# Patient Record
Sex: Female | Born: 1960 | Race: Black or African American | Hispanic: No | Smoking: Never smoker
Health system: Southern US, Community
[De-identification: ages and names within clinical notes are randomized; demographics above are authoritative.]

## PROBLEM LIST (undated history)

## (undated) DIAGNOSIS — Z86718 Personal history of other venous thrombosis and embolism: Secondary | ICD-10-CM

## (undated) DIAGNOSIS — G4730 Sleep apnea, unspecified: Secondary | ICD-10-CM

## (undated) DIAGNOSIS — I10 Essential (primary) hypertension: Secondary | ICD-10-CM

## (undated) DIAGNOSIS — Z950 Presence of cardiac pacemaker: Secondary | ICD-10-CM

## (undated) DIAGNOSIS — M549 Dorsalgia, unspecified: Secondary | ICD-10-CM

## (undated) HISTORY — DX: Dorsalgia, unspecified: M54.9

## (undated) HISTORY — DX: Sleep apnea, unspecified: G47.30

## (undated) HISTORY — PX: SKIN GRAFT: SHX250

## (undated) HISTORY — PX: OTHER SURGICAL HISTORY: SHX169

## (undated) HISTORY — DX: Personal history of other venous thrombosis and embolism: Z86.718

## (undated) HISTORY — PX: PACEMAKER IMPLANT: EP1218

## (undated) HISTORY — DX: Presence of cardiac pacemaker: Z95.0

---

## 2014-01-04 DIAGNOSIS — T2121XA Burn of second degree of chest wall, initial encounter: Secondary | ICD-10-CM | POA: Insufficient documentation

## 2018-06-28 DIAGNOSIS — I10 Essential (primary) hypertension: Secondary | ICD-10-CM | POA: Insufficient documentation

## 2018-06-28 DIAGNOSIS — Z8249 Family history of ischemic heart disease and other diseases of the circulatory system: Secondary | ICD-10-CM | POA: Insufficient documentation

## 2018-06-28 DIAGNOSIS — I495 Sick sinus syndrome: Secondary | ICD-10-CM | POA: Insufficient documentation

## 2018-06-29 HISTORY — PX: PACEMAKER PLACEMENT: SHX43

## 2018-06-30 DIAGNOSIS — Z950 Presence of cardiac pacemaker: Secondary | ICD-10-CM | POA: Insufficient documentation

## 2019-03-15 ENCOUNTER — Ambulatory Visit (HOSPITAL_BASED_OUTPATIENT_CLINIC_OR_DEPARTMENT_OTHER)
Admission: RE | Admit: 2019-03-15 | Discharge: 2019-03-15 | Disposition: A | Discharge status: Home / Self Care | Payer: Self-pay | Source: Ambulatory Visit | Attending: Emergency Medicine | Admitting: Emergency Medicine

## 2019-03-15 ENCOUNTER — Emergency Department (HOSPITAL_BASED_OUTPATIENT_CLINIC_OR_DEPARTMENT_OTHER)
Admission: EM | Admit: 2019-03-15 | Discharge: 2019-03-15 | Disposition: A | Discharge status: Home / Self Care | Payer: Self-pay | Attending: Emergency Medicine | Admitting: Emergency Medicine

## 2019-03-15 ENCOUNTER — Other Ambulatory Visit (HOSPITAL_BASED_OUTPATIENT_CLINIC_OR_DEPARTMENT_OTHER): Payer: Self-pay | Admitting: Emergency Medicine

## 2019-03-15 ENCOUNTER — Encounter (HOSPITAL_BASED_OUTPATIENT_CLINIC_OR_DEPARTMENT_OTHER): Payer: Self-pay

## 2019-03-15 ENCOUNTER — Other Ambulatory Visit: Payer: Self-pay

## 2019-03-15 DIAGNOSIS — M79604 Pain in right leg: Secondary | ICD-10-CM | POA: Insufficient documentation

## 2019-03-15 DIAGNOSIS — I10 Essential (primary) hypertension: Secondary | ICD-10-CM | POA: Insufficient documentation

## 2019-03-15 DIAGNOSIS — R52 Pain, unspecified: Secondary | ICD-10-CM

## 2019-03-15 DIAGNOSIS — Z950 Presence of cardiac pacemaker: Secondary | ICD-10-CM | POA: Insufficient documentation

## 2019-03-15 DIAGNOSIS — M25561 Pain in right knee: Secondary | ICD-10-CM | POA: Insufficient documentation

## 2019-03-15 DIAGNOSIS — Z79899 Other long term (current) drug therapy: Secondary | ICD-10-CM | POA: Insufficient documentation

## 2019-03-15 HISTORY — DX: Essential (primary) hypertension: I10

## 2019-03-15 LAB — BASIC METABOLIC PANEL
Anion gap: 7 (ref 5–15)
BUN: 18 mg/dL (ref 6–20)
CO2: 28 mmol/L (ref 22–32)
Calcium: 8.7 mg/dL — ABNORMAL LOW (ref 8.9–10.3)
Chloride: 106 mmol/L (ref 98–111)
Creatinine, Ser: 0.89 mg/dL (ref 0.44–1.00)
GFR calc Af Amer: >60 mL/min (ref >60)
GFR calc non Af Amer: >60 mL/min (ref >60)
Glucose, Bld: 113 mg/dL — ABNORMAL HIGH (ref 70–99)
Potassium: 3.7 mmol/L (ref 3.5–5.1)
Sodium: 141 mmol/L (ref 135–145)

## 2019-03-15 LAB — CBC WITH DIFFERENTIAL/PLATELET
Abs Immature Granulocytes: 0.01 10*3/uL (ref 0.00–0.07)
Basophils Absolute: 0 10*3/uL (ref 0.0–0.1)
Basophils Relative: 0 %
Eosinophils Absolute: 0.2 10*3/uL (ref 0.0–0.5)
Eosinophils Relative: 2 %
HCT: 39.3 % (ref 36.0–46.0)
Hemoglobin: 12.6 g/dL (ref 12.0–15.0)
Immature Granulocytes: 0 %
Lymphocytes Relative: 29 %
Lymphs Abs: 2.1 10*3/uL (ref 0.7–4.0)
MCH: 23.5 pg — ABNORMAL LOW (ref 26.0–34.0)
MCHC: 32.1 g/dL (ref 30.0–36.0)
MCV: 73.2 fL — ABNORMAL LOW (ref 80.0–100.0)
Monocytes Absolute: 0.6 10*3/uL (ref 0.1–1.0)
Monocytes Relative: 9 %
Neutro Abs: 4.2 10*3/uL (ref 1.7–7.7)
Neutrophils Relative %: 60 %
Platelets: 168 10*3/uL (ref 150–400)
RBC: 5.37 MIL/uL — ABNORMAL HIGH (ref 3.87–5.11)
RDW: 15.4 % (ref 11.5–15.5)
WBC: 7.2 10*3/uL (ref 4.0–10.5)
nRBC: 0 % (ref 0.0–0.2)

## 2019-03-15 MED ORDER — ENOXAPARIN SODIUM 150 MG/ML ~~LOC~~ SOLN
1.0000 mg/kg | Freq: Once | SUBCUTANEOUS | Status: AC
  Administered 2019-03-15: 145 mg via SUBCUTANEOUS
  Filled 2019-03-15: qty 1

## 2019-03-15 NOTE — ED Provider Notes (Signed)
[facility] EMERGENCY DEPARTMENT Provider Note   CSN: [id number] Arrival date & time: 03/15/19  0023     History   Chief Complaint Chief Complaint  Patient presents with  . Leg Pain    HPI Melanie Richard is a 58 y.o. female.     The history is provided by the patient.  Leg Pain Location:  Knee (popliteal fossa on R) Time since incident:  1 day Injury: no   Knee location:  R knee Pain details:    Quality:  Sharp   Radiates to:  Does not radiate   Severity:  Moderate   Onset quality:  Gradual   Timing:  Rare (2 episodes lasting 15 minutes each time) Chronicity:  New Dislocation: no   Foreign body present:  No foreign bodies Prior injury to area:  No Relieved by:  Nothing Worsened by:  Nothing Ineffective treatments:  None tried Associated symptoms: no back pain, no decreased ROM, no fatigue, no fever, no itching, no muscle weakness, no neck pain, no numbness, no stiffness, no swelling and no tingling   Associated symptoms comment:  No chest pain, no shortness of breath, no cough Risk factors: no recent illness     Past Medical History:  Diagnosis Date  . Hypertension     There are no active problems to display for this patient.   Past Surgical History:  Procedure Laterality Date  . PACEMAKER PLACEMENT       OB History   No obstetric history on file.      Home Medications    Prior to Admission medications   Medication Sig Start Date End Date Taking? Authorizing Provider  hydrochlorothiazide (MICROZIDE) 12.5 MG capsule Take 12.5 mg by mouth daily. 02/13/19   [provider]  lisinopril (ZESTRIL) 5 MG tablet Take 5 mg by mouth daily. 12/27/18   [provider]    Family History No family history on file.  Social History Social History   Tobacco Use  . Smoking status: Never Smoker  Substance Use Topics  . Alcohol use: Never    Frequency: Never  . Drug use: Never     Allergies   Patient has no allergy  information on record.   Review of Systems Review of Systems  Constitutional: Negative for fatigue and fever.  HENT: Negative for congestion.   Eyes: Negative for visual disturbance.  Respiratory: Negative for cough, chest tightness and shortness of breath.   Cardiovascular: Negative for chest pain, palpitations and leg swelling.  Gastrointestinal: Negative for abdominal pain.  Genitourinary: Negative for difficulty urinating.  Musculoskeletal: Negative for back pain, neck pain and stiffness.  Skin: Negative for itching.  Neurological: Negative for dizziness.  Psychiatric/Behavioral: Negative for agitation.  All other systems reviewed and are negative.    Physical Exam Updated Vital Signs BP (!) 151/107   Pulse 64   Temp 97.7 F (36.5 C) (Oral)   Resp 17   Ht 5\' 9"  (1.753 m)   Wt (!) 147.4 kg   SpO2 99%   BMI 47.99 kg/m   Physical Exam Vitals signs and nursing note reviewed.  Constitutional:      General: She is not in acute distress.    Appearance: Normal appearance.  HENT:     Head: Normocephalic and atraumatic.     Nose: Nose normal.  Eyes:     Extraocular Movements: Extraocular movements intact.     Conjunctiva/sclera: Conjunctivae normal.  Neck:     Musculoskeletal: Normal range of motion and  neck supple.  Cardiovascular:     Rate and Rhythm: Normal rate and regular rhythm.     Pulses: Normal pulses.     Heart sounds: Normal heart sounds.  Pulmonary:     Effort: Pulmonary effort is normal.     Breath sounds: Normal breath sounds.  Abdominal:     General: Abdomen is flat. Bowel sounds are normal.     Tenderness: There is no abdominal tenderness. There is no guarding.  Musculoskeletal:        General: No swelling or tenderness.     Right knee: Normal.     Right ankle: Normal. Achilles tendon normal.     Right upper leg: Normal.     Right lower leg: Normal. No edema.     Left lower leg: No edema.     Right foot: Normal.     Left foot: Normal.  Skin:     General: Skin is warm and dry.     Capillary Refill: Capillary refill takes less than 2 seconds.  Neurological:     General: No focal deficit present.     Mental Status: She is alert and oriented to person, place, and time.  Psychiatric:        Mood and Affect: Mood normal.        Behavior: Behavior normal.      ED Treatments / Results  Labs (all labs ordered are listed, but only abnormal results are displayed) Results for orders placed or performed during the hospital encounter of [id number]  Basic metabolic panel  Result Value Ref Range   Sodium 141 135 - 145 mmol/L   Potassium 3.7 3.5 - 5.1 mmol/L   Chloride 106 98 - 111 mmol/L   CO2 28 22 - 32 mmol/L   Glucose, Bld 113 (H) 70 - 99 mg/dL   BUN 18 6 - 20 mg/dL   Creatinine, Ser 0.89 0.44 - 1.00 mg/dL   Calcium 8.7 (L) 8.9 - 10.3 mg/dL   GFR calc non Af Amer >60 >60 mL/min   GFR calc Af Amer >60 >60 mL/min   Anion gap 7 5 - 15  CBC with Differential  Result Value Ref Range   WBC 7.2 4.0 - 10.5 K/uL   RBC 5.37 (H) 3.87 - 5.11 MIL/uL   Hemoglobin 12.6 12.0 - 15.0 g/dL   HCT 39.3 36.0 - 46.0 %   MCV 73.2 (L) 80.0 - 100.0 fL   MCH 23.5 (L) 26.0 - 34.0 pg   MCHC 32.1 30.0 - 36.0 g/dL   RDW 15.4 11.5 - 15.5 %   Platelets 168 150 - 400 K/uL   nRBC 0.0 0.0 - 0.2 %   Neutrophils Relative % 60 %   Neutro Abs 4.2 1.7 - 7.7 K/uL   Lymphocytes Relative 29 %   Lymphs Abs 2.1 0.7 - 4.0 K/uL   Monocytes Relative 9 %   Monocytes Absolute 0.6 0.1 - 1.0 K/uL   Eosinophils Relative 2 %   Eosinophils Absolute 0.2 0.0 - 0.5 K/uL   Basophils Relative 0 %   Basophils Absolute 0.0 0.0 - 0.1 K/uL   Immature Granulocytes 0 %   Abs Immature Granulocytes 0.01 0.00 - 0.07 K/uL   No results found.  Radiology No results found.  Procedures Procedures (including critical care time)  Medications Ordered in ED Medications  enoxaparin (LOVENOX) injection 145 mg (145 mg Subcutaneous Given 03/15/19 0125)     Initial Impression /  Assessment and Plan / ED Course  Given patient's history patient was given a dose of lovenox and scheduled for outpatient DVT study of the RLE.  Ice, and tylenol for pain.  I suspect this is actually a Baker's cyst but we must exclude DVT.  I do not suspect PE as the patient has normal pulse ox, no chest pain, no cough, no shortness of breath.   Melanie Richard was evaluated in Emergency Department on 03/15/2019 for the symptoms described in the history of present illness. She was evaluated in the context of the global COVID-19 pandemic, which necessitated consideration that the patient might be at risk for infection with the SARS-CoV-2 virus that causes COVID-19. Institutional protocols and algorithms that pertain to the evaluation of patients at risk for COVID-19 are in a state of rapid change based on information released by regulatory bodies including the CDC and federal and state organizations. These policies and algorithms were followed during the patient's care in the ED.   Final Clinical Impressions(s) / ED Diagnoses   Final diagnoses:  Right leg pain    Return for intractable cough, coughing up blood,fevers >100.4 unrelieved by medication, shortness of breath, intractable vomiting, chest pain, shortness of breath, weakness,numbness, changes in speech, facial asymmetry,abdominal pain, passing out,Inability to tolerate liquids or food, cough, altered mental status or any concerns. No signs of systemic illness or infection. The patient is nontoxic-appearing on exam and vital signs are within normal limits.   I have reviewed the triage vital signs and the nursing notes. Pertinent labs &imaging results that were available during my care of the patient were reviewed by me and considered in my medical decision making (see chart for details).  After history, exam, and medical workup I feel the patient has been appropriately medically screened and is safe for discharge home. Pertinent  diagnoses were discussed with the patient. Patient was given return precautions   Melanie Funnell, MD 03/15/19 0202

## 2019-03-15 NOTE — ED Triage Notes (Signed)
Pt presents with pain to R calf- hx of DVT in 2013. Pt denies SOB. Pt denies injury. Pt ambulatory to treatment room.

## 2019-03-15 NOTE — ED Notes (Signed)
Pain behind Left knee. Skin intact. No calf pain. Skin with small red area behind knee where pt has been rubbing.

## 2019-03-15 NOTE — ED Notes (Signed)
Pt has outpatient [place] appointment for 11/27 at 0900

## 2019-07-22 ENCOUNTER — Other Ambulatory Visit: Payer: Self-pay

## 2019-07-23 ENCOUNTER — Other Ambulatory Visit (HOSPITAL_COMMUNITY)
Admission: RE | Admit: 2019-07-23 | Discharge: 2019-07-23 | Disposition: A | Discharge status: Home / Self Care | Payer: 59 | Source: Ambulatory Visit | Attending: Nurse Practitioner | Admitting: Nurse Practitioner

## 2019-07-23 ENCOUNTER — Encounter: Payer: Self-pay | Admitting: Nurse Practitioner

## 2019-07-23 ENCOUNTER — Ambulatory Visit (INDEPENDENT_AMBULATORY_CARE_PROVIDER_SITE_OTHER): Payer: 59 | Admitting: Nurse Practitioner

## 2019-07-23 ENCOUNTER — Other Ambulatory Visit: Payer: Self-pay | Admitting: Nurse Practitioner

## 2019-07-23 VITALS — BP 130/88 | HR 70 | Temp 96.7°F | Ht 69.69 in | Wt 348.8 lb

## 2019-07-23 DIAGNOSIS — Z124 Encounter for screening for malignant neoplasm of cervix: Secondary | ICD-10-CM | POA: Insufficient documentation

## 2019-07-23 DIAGNOSIS — Z13220 Encounter for screening for lipoid disorders: Secondary | ICD-10-CM | POA: Diagnosis not present

## 2019-07-23 DIAGNOSIS — G4733 Obstructive sleep apnea (adult) (pediatric): Secondary | ICD-10-CM | POA: Diagnosis not present

## 2019-07-23 DIAGNOSIS — I10 Essential (primary) hypertension: Secondary | ICD-10-CM

## 2019-07-23 DIAGNOSIS — Z0001 Encounter for general adult medical examination with abnormal findings: Secondary | ICD-10-CM | POA: Diagnosis not present

## 2019-07-23 DIAGNOSIS — Z1231 Encounter for screening mammogram for malignant neoplasm of breast: Secondary | ICD-10-CM

## 2019-07-23 DIAGNOSIS — N841 Polyp of cervix uteri: Secondary | ICD-10-CM | POA: Insufficient documentation

## 2019-07-23 DIAGNOSIS — Z9989 Dependence on other enabling machines and devices: Secondary | ICD-10-CM

## 2019-07-23 DIAGNOSIS — Z136 Encounter for screening for cardiovascular disorders: Secondary | ICD-10-CM | POA: Diagnosis not present

## 2019-07-23 DIAGNOSIS — Z1211 Encounter for screening for malignant neoplasm of colon: Secondary | ICD-10-CM

## 2019-07-23 DIAGNOSIS — Z0000 Encounter for general adult medical examination without abnormal findings: Secondary | ICD-10-CM | POA: Insufficient documentation

## 2019-07-23 LAB — COMPREHENSIVE METABOLIC PANEL
ALT: 9 U/L (ref 0–35)
AST: 11 U/L (ref 0–37)
Albumin: 3.9 g/dL (ref 3.5–5.2)
Alkaline Phosphatase: 69 U/L (ref 39–117)
BUN: 17 mg/dL (ref 6–23)
CO2: 28 mEq/L (ref 19–32)
Calcium: 9.1 mg/dL (ref 8.4–10.5)
Chloride: 103 mEq/L (ref 96–112)
Creatinine, Ser: 0.81 mg/dL (ref 0.40–1.20)
GFR: 88.3 mL/min (ref >60.00)
Glucose, Bld: 104 mg/dL — ABNORMAL HIGH (ref 70–99)
Potassium: 4.3 mEq/L (ref 3.5–5.1)
Sodium: 138 mEq/L (ref 135–145)
Total Bilirubin: 0.5 mg/dL (ref 0.2–1.2)
Total Protein: 6.9 g/dL (ref 6.0–8.3)

## 2019-07-23 LAB — LIPID PANEL
Cholesterol: 174 mg/dL (ref 0–200)
HDL: 44.2 mg/dL (ref >39.00)
LDL Cholesterol: 107 mg/dL — ABNORMAL HIGH (ref 0–99)
NonHDL: 130.01
Total CHOL/HDL Ratio: 4
Triglycerides: 114 mg/dL (ref 0.0–149.0)
VLDL: 22.8 mg/dL (ref 0.0–40.0)

## 2019-07-23 LAB — CBC
HCT: 39.9 % (ref 36.0–46.0)
Hemoglobin: 13.2 g/dL (ref 12.0–15.0)
MCHC: 33.2 g/dL (ref 30.0–36.0)
MCV: 71.6 fl — ABNORMAL LOW (ref 78.0–100.0)
Platelets: 163 10*3/uL (ref 150.0–400.0)
RBC: 5.58 Mil/uL — ABNORMAL HIGH (ref 3.87–5.11)
RDW: 16.5 % — ABNORMAL HIGH (ref 11.5–15.5)
WBC: 7.1 10*3/uL (ref 4.0–10.5)

## 2019-07-23 LAB — TSH: TSH: 0.67 u[IU]/mL (ref 0.35–4.50)

## 2019-07-23 MED ORDER — LISINOPRIL 5 MG PO TABS: 5.0000 mg | ORAL_TABLET | Freq: Every day | ORAL | 1 refills | Status: DC

## 2019-07-23 MED ORDER — HYDROCHLOROTHIAZIDE 12.5 MG PO CAPS: 12.5000 mg | ORAL_CAPSULE | Freq: Every day | ORAL | 0 refills | Status: DC

## 2019-07-23 NOTE — Progress Notes (Signed)
Subjective:    Patient ID: Melanie Richard, female    DOB: 07/04/1960, 59 y.o.   MRN: [id number]  Patient presents today for complete physical and establish care (new patient)  HPI Melanie Richard reports hx of HTN, OSA with CPAP use, Sinus Node dysfunction which led to insertion of pacemaker in 2020. Today Melanie Richard denies any acute complaints.  HTN: BP at goal with HCTZ and lisinopril  Sexual History (orientation,birth control, marital status, STD):divorced, sexually active, denies need for STD screen, postmenopausal, needs repeat PAP, denies hx of abnormal PAP, also needs referral to mammogram. Unable to complete colonoscopy last year due to marked sinus bradycardia.  Depression/Suicide: Depression screen [facility] 2/9 07/23/2019  Decreased Interest 0  Down, Depressed, Hopeless 0  PHQ - 2 Score 0   Vision:up to date  Dental:dentures (upper and lower)  Immunizations: (TDAP, Hep C screen, Pneumovax, Influenza, zoster)  Health Maintenance  Topic Date Due  .  Hepatitis C: One time screening is recommended by Center for Disease Control  (CDC) for  adults born from [birth year] through 1965.   Never done  . HIV Screening  Never done  . Tetanus Vaccine  Never done  . Pap Smear  Never done  . Mammogram  Never done  . Colon Cancer Screening  Never done  . Flu Shot  11/17/2019   Diet:regular.  Weight:  Wt Readings from Last 3 Encounters:  07/23/19 (!) 348 lb 12.8 oz (158.2 kg)  03/15/19 (!) 325 lb (147.4 kg)    Exercise:none  Fall Risk: Fall Risk  07/23/2019  Falls in the past year? 0  Number falls in past yr: 0  Injury with Fall? 0   Advanced Directive: Advanced Directives 03/15/2019  Does Patient Have a Medical Advance Directive? No  Would patient like information on creating a medical advance directive? No - Patient declined     Medications and allergies reviewed with patient and updated if appropriate.  Patient Active Problem List   Diagnosis Date Noted  . OSA on CPAP 07/23/2019  .  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 Cardiac pacemaker 06/30/2018  . Essential hypertension 06/28/2018  . Family history of cardiac pacemaker 06/28/2018  . Sinus node dysfunction ([place]) 06/28/2018    Current Outpatient Medications on File Prior to Visit  Medication Sig Dispense Refill  . aspirin 81 MG EC tablet Take by mouth.     No current facility-administered medications on file prior to visit.    Past Medical History:  Diagnosis Date  . Hypertension   . Sleep apnea     Past Surgical History:  Procedure Laterality Date  . PACEMAKER IMPLANT    . PACEMAKER PLACEMENT  06/29/2018    Social History   Socioeconomic History  . Marital status: Divorced    Spouse name: Not on file  . Number of children: 2  . Years of education: Not on file  . Highest education level: Not on file  Occupational History  . Not on file  Tobacco Use  . Smoking status: Never Smoker  . Smokeless tobacco: Never Used  Substance and Sexual Activity  . Alcohol use: Never  . Drug use: Never  . Sexual activity: Yes    Birth control/protection: Post-menopausal  Other Topics Concern  . Not on file  Social History Narrative  . Not on file   Social Determinants of Health   Financial Resource Strain:   . Difficulty of Paying Living Expenses:   Food Insecurity:   . Worried About Running  Out of Food in the Last Year:   . [place] in the Last Year:   Transportation Needs:   . Lack of Transportation (Medical):   Marland Kitchen Lack of Transportation (Non-Medical):   Physical Activity:   . Days of Exercise per Week:   . Minutes of Exercise per Session:   Stress:   . Feeling of Stress :   Social Connections:   . Frequency of Communication with Friends and Family:   . Frequency of Social Gatherings with Friends and Family:   . Attends Religious Services:   . Active Member of Clubs or Organizations:   . Attends Archivist Meetings:   Marland Kitchen Marital Status:     Family History  Problem Relation  Age of Onset  . Hypertension Mother   . Hypertension Father   . Diabetes Brother   . Intellectual disability Brother   . Cancer Maternal Aunt 40       Breast cancer  . Cancer Maternal Uncle        kidney        Review of Systems  Constitutional: Negative for fever, malaise/fatigue and weight loss.  HENT: Negative for congestion and sore throat.   Eyes:       Negative for visual changes  Respiratory: Negative for cough and shortness of breath.   Cardiovascular: Negative for chest pain, palpitations and leg swelling.  Gastrointestinal: Negative for blood in stool, constipation, diarrhea and heartburn.  Genitourinary: Negative for dysuria, frequency and urgency.  Musculoskeletal: Negative for falls, joint pain and myalgias.  Skin: Negative for rash.  Neurological: Negative for dizziness, sensory change and headaches.  Endo/Heme/Allergies: Does not bruise/bleed easily.  Psychiatric/Behavioral: Negative for depression, substance abuse and suicidal ideas. The patient is not nervous/anxious.     Objective:   Vitals:   07/23/19 0821  BP: 130/88  Pulse: 70  Temp: (!) 96.7 F (35.9 C)  SpO2: 96%    Body mass index is 50.5 kg/m.   Physical Examination:  Physical Exam Vitals reviewed. Exam conducted with a chaperone present.  Constitutional:      General: Melanie Richard is not in acute distress.    Appearance: Melanie Richard is obese.  HENT:     Right Ear: Tympanic membrane, ear canal and external ear normal.     Left Ear: Tympanic membrane, ear canal and external ear normal.  Eyes:     General: No scleral icterus.    Extraocular Movements: Extraocular movements intact.     Conjunctiva/sclera: Conjunctivae normal.  Neck:     Thyroid: No thyromegaly.  Cardiovascular:     Rate and Rhythm: Normal rate and regular rhythm.     Pulses: Normal pulses.     Heart sounds: Normal heart sounds.  Pulmonary:     Effort: Pulmonary effort is normal.     Breath sounds: Normal breath sounds.  Chest:       Chest wall: No tenderness.     Breasts:        Right: Normal.        Left: Normal.  Abdominal:     General: Bowel sounds are normal. There is no distension.     Palpations: Abdomen is soft.     Tenderness: There is no abdominal tenderness.  Genitourinary:    Labia:        Right: No rash or tenderness.        Left: No rash or tenderness.      Vagina: Normal.     Cervix:  No cervical motion tenderness, discharge or friability.     Uterus: Normal.      Adnexa: Right adnexa normal and left adnexa normal.     Musculoskeletal:        General: No tenderness. Normal range of motion.     Cervical back: Normal range of motion and neck supple.     Right lower leg: No edema.     Left lower leg: No edema.  Lymphadenopathy:     Cervical: No cervical adenopathy.     Upper Body:     Right upper body: No supraclavicular, axillary or pectoral adenopathy.     Left upper body: No supraclavicular, axillary or pectoral adenopathy.     Lower Body: No right inguinal adenopathy. No left inguinal adenopathy.  Skin:    General: Skin is warm and dry.  Neurological:     Mental Status: Melanie Richard is alert and oriented to person, place, and time.  Psychiatric:        Mood and Affect: Mood normal.        Behavior: Behavior normal.        Thought Content: Thought content normal.        Judgment: Judgment normal.    ASSESSMENT and PLAN: This visit occurred during the SARS-CoV-2 public health emergency.  Safety protocols were in place, including screening questions prior to the visit, additional usage of staff PPE, and extensive cleaning of exam room while observing appropriate contact time as indicated for disinfecting solutions.   Melanie Richard was seen today for establish care.  Diagnoses and all orders for this visit:  Encounter for preventative adult health care exam with abnormal findings -     CBC -     Comprehensive metabolic panel -     TSH -     Lipid panel -     Cytology - PAP( [facility])  Encounter for lipid screening for cardiovascular disease -     Lipid panel  Encounter for Papanicolaou smear for cervical cancer screening -     Cytology - PAP( [facility])  OSA on CPAP -     Ambulatory referral to Pulmonology  Breast cancer screening by mammogram -     Cancel: MM DIGITAL SCREENING BILATERAL; Future  Colon cancer screening -     Ambulatory referral to Gastroenterology  Essential hypertension -     lisinopril (ZESTRIL) 5 MG tablet; Take 1 tablet (5 mg total) by mouth daily. -     hydrochlorothiazide (MICROZIDE) 12.5 MG capsule; Take 1 capsule (12.5 mg total) by mouth daily.   No problem-specific Assessment & Plan notes found for this encounter.     Problem List Items Addressed This Visit      Cardiovascular and Mediastinum   Essential hypertension   Relevant Medications   aspirin 81 MG EC tablet   lisinopril (ZESTRIL) 5 MG tablet   hydrochlorothiazide (MICROZIDE) 12.5 MG capsule     Respiratory   OSA on CPAP   Relevant Orders   Ambulatory referral to Pulmonology    Other Visit Diagnoses    Encounter for preventative adult health care exam with abnormal findings    -  Primary   Relevant Orders   CBC (Completed)   Comprehensive metabolic panel (Completed)   TSH (Completed)   Lipid panel (Completed)   Cytology - PAP( [place])   Encounter for lipid screening for cardiovascular disease       Relevant Orders   Lipid panel (Completed)   Encounter for  Papanicolaou smear for cervical cancer screening       Relevant Orders   Cytology - PAP( [place])   Breast cancer screening by mammogram       Colon cancer screening       Relevant Orders   Ambulatory referral to Gastroenterology       Follow up: Return if symptoms worsen or fail to improve.  Wilfred Lacy, NP

## 2019-07-23 NOTE — Patient Instructions (Addendum)
Thank you for choosing [facility] for your health needs  Sign medical release form to get records from previous sleep specialist, pcp and GYN.  You will be contacted to schedule appt for mammogram, colonoscopy and with pulmonologist.  Stable lab results Lisinopril and HCTZ sent  F/up in 36months   Health Maintenance, Female Adopting a healthy lifestyle and getting preventive care are important in promoting health and wellness. Ask your health care provider about:  The right schedule for you to have regular tests and exams.  Things you can do on your own to prevent diseases and keep yourself healthy. What should I know about diet, weight, and exercise? Eat a healthy diet   Eat a diet that includes plenty of vegetables, fruits, low-fat dairy products, and lean protein.  Do not eat a lot of foods that are high in solid fats, added sugars, or sodium. Maintain a healthy weight Body mass index (BMI) is used to identify weight problems. It estimates body fat based on height and weight. Your health care provider can help determine your BMI and help you achieve or maintain a healthy weight. Get regular exercise Get regular exercise. This is one of the most important things you can do for your health. Most adults should:  Exercise for at least 150 minutes each week. The exercise should increase your heart rate and make you sweat (moderate-intensity exercise).  Do strengthening exercises at least twice a week. This is in addition to the moderate-intensity exercise.  Spend less time sitting. Even light physical activity can be beneficial. Watch cholesterol and blood lipids Have your blood tested for lipids and cholesterol at 57 years of age, then have this test every 5 years. Have your cholesterol levels checked more often if:  Your lipid or cholesterol levels are high.  You are older than 57 years of age.  You are at high risk for heart disease. What should I know about  cancer screening? Depending on your health history and family history, you may need to have cancer screening at various ages. This may include screening for:  Breast cancer.  Cervical cancer.  Colorectal cancer.  Skin cancer.  Lung cancer. What should I know about heart disease, diabetes, and high blood pressure? Blood pressure and heart disease  High blood pressure causes heart disease and increases the risk of stroke. This is more likely to develop in people who have high blood pressure readings, are of African descent, or are overweight.  Have your blood pressure checked: ? Every 3-5 years if you are 39-68 years of age. ? Every year if you are 3 years old or older. Diabetes Have regular diabetes screenings. This checks your fasting blood sugar level. Have the screening done:  Once every three years after age 30 if you are at a normal weight and have a low risk for diabetes.  More often and at a younger age if you are overweight or have a high risk for diabetes. What should I know about preventing infection? Hepatitis B If you have a higher risk for hepatitis B, you should be screened for this virus. Talk with your health care provider to find out if you are at risk for hepatitis B infection. Hepatitis C Testing is recommended for:  Everyone born from [birth year] through 1965.  Anyone with known risk factors for hepatitis C. Sexually transmitted infections (STIs)  Get screened for STIs, including gonorrhea and chlamydia, if: ? You are sexually active and are younger than 57 years of  age. ? You are older than 57 years of age and your health care provider tells you that you are at risk for this type of infection. ? Your sexual activity has changed since you were last screened, and you are at increased risk for chlamydia or gonorrhea. Ask your health care provider if you are at risk.  Ask your health care provider about whether you are at high risk for HIV. Your health care  provider may recommend a prescription medicine to help prevent HIV infection. If you choose to take medicine to prevent HIV, you should first get tested for HIV. You should then be tested every 3 months for as long as you are taking the medicine. Pregnancy  If you are about to stop having your period (premenopausal) and you may become pregnant, seek counseling before you get pregnant.  Take 400 to 800 micrograms (mcg) of folic acid every day if you become pregnant.  Ask for birth control (contraception) if you want to prevent pregnancy. Osteoporosis and menopause Osteoporosis is a disease in which the bones lose minerals and strength with aging. This can result in bone fractures. If you are 25 years old or older, or if you are at risk for osteoporosis and fractures, ask your health care provider if you should:  Be screened for bone loss.  Take a calcium or vitamin D supplement to lower your risk of fractures.  Be given hormone replacement therapy (HRT) to treat symptoms of menopause. Follow these instructions at home: Lifestyle  Do not use any products that contain nicotine or tobacco, such as cigarettes, e-cigarettes, and chewing tobacco. If you need help quitting, ask your health care provider.  Do not use street drugs.  Do not share needles.  Ask your health care provider for help if you need support or information about quitting drugs. Alcohol use  Do not drink alcohol if: ? Your health care provider tells you not to drink. ? You are pregnant, may be pregnant, or are planning to become pregnant.  If you drink alcohol: ? Limit how much you use to 0-1 drink a day. ? Limit intake if you are breastfeeding.  Be aware of how much alcohol is in your drink. In the [place] one drink equals one 12 oz bottle of beer (355 mL), one 5 oz glass of wine (148 mL), or one 1 oz glass of hard liquor (44 mL). General instructions  Schedule regular health, dental, and eye exams.  Stay current  with your vaccines.  Tell your health care provider if: ? You often feel depressed. ? You have ever been abused or do not feel safe at home. Summary  Adopting a healthy lifestyle and getting preventive care are important in promoting health and wellness.  Follow your health care provider's instructions about healthy diet, exercising, and getting tested or screened for diseases.  Follow your health care provider's instructions on monitoring your cholesterol and blood pressure. This information is not intended to replace advice given to you by your health care provider. Make sure you discuss any questions you have with your health care provider. Document Revised: 03/28/2018 Document Reviewed: 03/28/2018 Elsevier Patient Education  2020 [employer].

## 2019-07-24 ENCOUNTER — Encounter: Payer: Self-pay | Admitting: Internal Medicine

## 2019-07-24 LAB — CYTOLOGY - PAP
Comment: NEGATIVE
Diagnosis: NEGATIVE
High risk HPV: NEGATIVE

## 2019-08-05 ENCOUNTER — Other Ambulatory Visit: Payer: Self-pay | Admitting: Internal Medicine

## 2019-08-05 ENCOUNTER — Telehealth: Payer: Self-pay | Admitting: *Deleted

## 2019-08-05 DIAGNOSIS — Z1211 Encounter for screening for malignant neoplasm of colon: Secondary | ICD-10-CM

## 2019-08-05 DIAGNOSIS — Z01818 Encounter for other preprocedural examination: Secondary | ICD-10-CM

## 2019-08-05 NOTE — Telephone Encounter (Signed)
Ok for direct hospital colonoscopy for screening CC: Melanie Richard

## 2019-08-05 NOTE — Telephone Encounter (Signed)
Patient scheduled for 09/30/19 at 9:45 am, 8:15 am arrival at [facility] endoscopy. Please advise patient when she comes for previsit.

## 2019-08-05 NOTE — Telephone Encounter (Signed)
Dr.Pyrtle,  This patient is referred to [place] for screening colonoscopy. Her last BMI was 50.50 on 07/23/2019. She did have a pacemaker placed 06/2018, hx of OSA, HTN. Would you like this patient to have an OV first or direct screening colonoscopy at hospital ok? Please advise. Thank you, Landry Kamath pv

## 2019-08-16 ENCOUNTER — Ambulatory Visit: Payer: 59

## 2019-08-23 ENCOUNTER — Encounter: Payer: 59 | Admitting: Internal Medicine

## 2019-08-30 ENCOUNTER — Encounter: Payer: Self-pay | Admitting: Pulmonary Disease

## 2019-08-30 ENCOUNTER — Other Ambulatory Visit: Payer: Self-pay

## 2019-08-30 ENCOUNTER — Ambulatory Visit (INDEPENDENT_AMBULATORY_CARE_PROVIDER_SITE_OTHER): Payer: 59 | Admitting: Pulmonary Disease

## 2019-08-30 VITALS — BP 140/100 | HR 73 | Temp 97.9°F | Ht 69.5 in | Wt 351.4 lb

## 2019-08-30 DIAGNOSIS — Z9989 Dependence on other enabling machines and devices: Secondary | ICD-10-CM

## 2019-08-30 DIAGNOSIS — G4733 Obstructive sleep apnea (adult) (pediatric): Secondary | ICD-10-CM | POA: Diagnosis not present

## 2019-08-30 NOTE — Progress Notes (Addendum)
Melanie Richard    [id number]    Mar 19, 1961  Primary Care Physician:Nche, Charlene Brooke, NP  Referring Physician: Flossie Buffy, NP [address] [place],  [place] [address]  Chief complaint:   History of moderate obstructive sleep apnea  HPI:  Moderate obstructive sleep apnea Has been on CPAP  Decreased compliance recently secondary to work schedule works 7 PM to 7 AM, and then 8-12 3 days a week Less frequently and other days Has not been using her machine regularly  Was following up with a sleep physician in [place] but relocated  Usually goes to bed about 12 noon About 30 minutes to fall asleep 2-3 awakenings  20 pound weight gain  Sleep cycle and sleep hours depend on work schedule  History of hypertension, irregular heartbeat with a pacemaker Blood clots in 2013  She feels she is doing relatively well  She does get CPAP supplies [phone] is the office number Other number is [phone]  Outpatient Encounter Medications as of 08/30/2019  Medication Sig  . aspirin 81 MG EC tablet Take by mouth.  . hydrochlorothiazide (MICROZIDE) 12.5 MG capsule Take 1 capsule (12.5 mg total) by mouth daily.  Marland Kitchen lisinopril (ZESTRIL) 5 MG tablet Take 1 tablet (5 mg total) by mouth daily.   No facility-administered encounter medications on file as of 08/30/2019.    Allergies as of 08/30/2019  . (Not on File)    Past Medical History:  Diagnosis Date  . Hypertension   . Sleep apnea     Past Surgical History:  Procedure Laterality Date  . PACEMAKER IMPLANT    . PACEMAKER PLACEMENT  06/29/2018    Family History  Problem Relation Age of Onset  . Hypertension Mother   . Hypertension Father   . Diabetes Brother   . Intellectual disability Brother   . Cancer Maternal Aunt 40       Breast cancer  . Cancer Maternal Uncle        kidney    Social History   Socioeconomic History  . Marital status: Divorced    Spouse name: Not on file  .  Number of children: 2  . Years of education: Not on file  . Highest education level: Not on file  Occupational History  . Not on file  Tobacco Use  . Smoking status: Never Smoker  . Smokeless tobacco: Never Used  Substance and Sexual Activity  . Alcohol use: Never  . Drug use: Never  . Sexual activity: Yes    Birth control/protection: Post-menopausal  Other Topics Concern  . Not on file  Social History Narrative  . Not on file   Social Determinants of Health   Financial Resource Strain:   . Difficulty of Paying Living Expenses:   Food Insecurity:   . Worried About Charity fundraiser in the Last Year:   . Arboriculturist in the Last Year:   Transportation Needs:   . Film/video editor (Medical):   Marland Kitchen Lack of Transportation (Non-Medical):   Physical Activity:   . Days of Exercise per Week:   . Minutes of Exercise per Session:   Stress:   . Feeling of Stress :   Social Connections:   . Frequency of Communication with Friends and Family:   . Frequency of Social Gatherings with Friends and Family:   . Attends Religious Services:   . Active Member of Clubs or Organizations:   . Attends Club or  Organization Meetings:   Marland Kitchen Marital Status:   Intimate Partner Violence:   . Fear of Current or Ex-Partner:   . Emotionally Abused:   Marland Kitchen Physically Abused:   . Sexually Abused:     Review of Systems  Respiratory: Positive for apnea and shortness of breath.   Psychiatric/Behavioral: Positive for sleep disturbance.    Vitals:   08/30/19 1430  BP: (!) 140/100  Pulse: 73  Temp: 97.9 F (36.6 C)  SpO2: 98%     Physical Exam  Constitutional: She appears well-developed.  Obese  HENT:  Head: Normocephalic.  Eyes: Pupils are equal, round, and reactive to light. Right eye exhibits no discharge. Left eye exhibits no discharge.  Neck: No tracheal deviation present. No thyromegaly present.  Cardiovascular: Normal rate and regular rhythm.  Pulmonary/Chest: Effort normal and  breath sounds normal. No respiratory distress. She has no wheezes. She exhibits no tenderness.  Musculoskeletal:        General: No edema.  Neurological: She is alert.  Psychiatric: She has a normal mood and affect.    Epworth Sleepiness Scale of 8  Data Reviewed: Download not available at present  Assessment:  Moderate obstructive sleep apnea  Morbid obesity   Plan/Recommendations: Encouraged to use CPAP on a regular basis  We will try and get download from her machine  Encouraged adequate number of hours of sleep 6 to 8 hours  We will follow up in 6 months  She is to call with any significant concerns   Sherrilyn Rist MD [facility] 08/30/2019, 2:42 PM  CC: Nche, Charlene Brooke, NP

## 2019-08-30 NOTE — Addendum Note (Signed)
Addended by: Vanessa Barbara on: 08/30/2019 05:18 PM   Modules accepted: Orders

## 2019-08-30 NOTE — Patient Instructions (Signed)
History of moderate obstructive sleep apnea  We will send a prescription in for CPAP supplies Encourage CPAP use on a regular basis  Call with significant concerns  Follow-up in 6 months Sleep Apnea Sleep apnea is a condition in which breathing pauses or becomes shallow during sleep. Episodes of sleep apnea usually last 10 seconds or longer, and they may occur as many as 20 times an hour. Sleep apnea disrupts your sleep and keeps your body from getting the rest that it needs. This condition can increase your risk of certain health problems, including:  Heart attack.  Stroke.  Obesity.  Diabetes.  Heart failure.  Irregular heartbeat. What are the causes? There are three kinds of sleep apnea:  Obstructive sleep apnea. This kind is caused by a blocked or collapsed airway.  Central sleep apnea. This kind happens when the part of the brain that controls breathing does not send the correct signals to the muscles that control breathing.  Mixed sleep apnea. This is a combination of obstructive and central sleep apnea. The most common cause of this condition is a collapsed or blocked airway. An airway can collapse or become blocked if:  Your throat muscles are abnormally relaxed.  Your tongue and tonsils are larger than normal.  You are overweight.  Your airway is smaller than normal. What increases the risk? You are more likely to develop this condition if you:  Are overweight.  Smoke.  Have a smaller than normal airway.  Are elderly.  Are female.  Drink alcohol.  Take sedatives or tranquilizers.  Have a family history of sleep apnea. What are the signs or symptoms? Symptoms of this condition include:  Trouble staying asleep.  Daytime sleepiness and tiredness.  Irritability.  Loud snoring.  Morning headaches.  Trouble concentrating.  Forgetfulness.  Decreased interest in sex.  Unexplained sleepiness.  Mood swings.  Personality changes.  Feelings  of depression.  Waking up often during the night to urinate.  Dry mouth.  Sore throat. How is this diagnosed? This condition may be diagnosed with:  A medical history.  A physical exam.  A series of tests that are done while you are sleeping (sleep study). These tests are usually done in a sleep lab, but they may also be done at home. How is this treated? Treatment for this condition aims to restore normal breathing and to ease symptoms during sleep. It may involve managing health issues that can affect breathing, such as high blood pressure or obesity. Treatment may include:  Sleeping on your side.  Using a decongestant if you have nasal congestion.  Avoiding the use of depressants, including alcohol, sedatives, and narcotics.  Losing weight if you are overweight.  Making changes to your diet.  Quitting smoking.  Using a device to open your airway while you sleep, such as: ? An oral appliance. This is a custom-made mouthpiece that shifts your lower jaw forward. ? A continuous positive airway pressure (CPAP) device. This device blows air through a mask when you breathe out (exhale). ? A nasal expiratory positive airway pressure (EPAP) device. This device has valves that you put into each nostril. ? A bi-level positive airway pressure (BPAP) device. This device blows air through a mask when you breathe in (inhale) and breathe out (exhale).  Having surgery if other treatments do not work. During surgery, excess tissue is removed to create a wider airway. It is important to get treatment for sleep apnea. Without treatment, this condition can lead to:  High  blood pressure.  Coronary artery disease.  In men, an inability to achieve or maintain an erection (impotence).  Reduced thinking abilities. Follow these instructions at home: Lifestyle  Make any lifestyle changes that your health care provider recommends.  Eat a healthy, well-balanced diet.  Take steps to lose  weight if you are overweight.  Avoid using depressants, including alcohol, sedatives, and narcotics.  Do not use any products that contain nicotine or tobacco, such as cigarettes, e-cigarettes, and chewing tobacco. If you need help quitting, ask your health care provider. General instructions  Take over-the-counter and prescription medicines only as told by your health care provider.  If you were given a device to open your airway while you sleep, use it only as told by your health care provider.  If you are having surgery, make sure to tell your health care provider you have sleep apnea. You may need to bring your device with you.  Keep all follow-up visits as told by your health care provider. This is important. Contact a health care provider if:  The device that you received to open your airway during sleep is uncomfortable or does not seem to be working.  Your symptoms do not improve.  Your symptoms get worse. Get help right away if:  You develop: ? Chest pain. ? Shortness of breath. ? Discomfort in your back, arms, or stomach.  You have: ? Trouble speaking. ? Weakness on one side of your body. ? Drooping in your face. These symptoms may represent a serious problem that is an emergency. Do not wait to see if the symptoms will go away. Get medical help right away. Call your local emergency services (911 in the [place] Do not drive yourself to the hospital. Summary  Sleep apnea is a condition in which breathing pauses or becomes shallow during sleep.  The most common cause is a collapsed or blocked airway.  The goal of treatment is to restore normal breathing and to ease symptoms during sleep. This information is not intended to replace advice given to you by your health care provider. Make sure you discuss any questions you have with your health care provider. Document Revised: 09/19/2018 Document Reviewed: 11/28/2017 Elsevier Patient Education  [place].

## 2019-09-09 ENCOUNTER — Telehealth: Payer: Self-pay | Admitting: Pulmonary Disease

## 2019-09-09 NOTE — Telephone Encounter (Signed)
Order was placed 5/14 for pt to receive cpap supplies. This was cosigned by AO 5/17.  Called and spoke with pt letting her know that we are checking with [facility] about order that was placed for cpap supplies and she verbalized understanding.  Routing to [facility] pool.

## 2019-09-10 ENCOUNTER — Telehealth: Payer: Self-pay | Admitting: Pulmonary Disease

## 2019-09-10 NOTE — Telephone Encounter (Signed)
Spoke with [facility] in [place] and they are faxing the sleep report to me.

## 2019-09-10 NOTE — Telephone Encounter (Signed)
I have sent this order to [facility]

## 2019-09-10 NOTE — Telephone Encounter (Signed)
Loni Beckwith, Drema Halon, Allyne Gee, Sylvester; Jaquita Rector; 1 other  Chantel, I have checked both systems we use ([facility] and [facility]) and this patient is not in either system, therefore, she would be considered a new patient for [place]. In order to provide her with new supplies and bill her insurance, we would need:   -Baseline sleep study w/signed interpretation.    I have checked Epic and it is not in there.   Please let me know when it is available to pull or you can fax to [phone].   Thank you  Sonia Baller

## 2019-09-10 NOTE — Telephone Encounter (Signed)
I called [facility] they are not in network for the patient I have also called the patient and told her I would switch her DME company

## 2019-09-11 NOTE — Telephone Encounter (Signed)
Keeping encounter open while we await the fax of pt's sleep study to come. Jonelle Sidle, please update when you have received the fax.

## 2019-09-13 ENCOUNTER — Ambulatory Visit (AMBULATORY_SURGERY_CENTER): Payer: Self-pay | Admitting: *Deleted

## 2019-09-13 ENCOUNTER — Other Ambulatory Visit: Payer: Self-pay

## 2019-09-13 VITALS — Ht 69.5 in | Wt 349.0 lb

## 2019-09-13 DIAGNOSIS — Z01818 Encounter for other preprocedural examination: Secondary | ICD-10-CM

## 2019-09-13 DIAGNOSIS — Z86010 Personal history of colonic polyps: Secondary | ICD-10-CM

## 2019-09-13 MED ORDER — NA SULFATE-K SULFATE-MG SULF 17.5-3.13-1.6 GM/177ML PO SOLN: 1.0000 | Freq: Once | ORAL | 0 refills | Status: AC

## 2019-09-13 NOTE — Progress Notes (Signed)

## 2019-09-17 ENCOUNTER — Other Ambulatory Visit: Payer: Self-pay

## 2019-09-17 NOTE — Telephone Encounter (Signed)
Spoke with the pt  She states that someone at [facility] advised her that she can not get her supplies bc the ov note stated that she is non compliant with CPAP  I advised per our records, they are just waiting on a signed sleep study to be faxed  I am now not sure what they are needing  Pt does not know who she spoke with  [facility] for [place] at [facility]

## 2019-09-17 NOTE — Telephone Encounter (Signed)
Called Melanie Richard  She states that according to notes pt only using CPAP 3 nights per wk  She has to use at least 4 hours per night for 21-30 days and then come in for ov to document compliance  Pt made aware of this  She is scheduled for rov with Dr Jenetta Downer 11/11/19 (refused sooner with APP and can only come on a Monday)

## 2019-09-17 NOTE — Telephone Encounter (Signed)
unfortunately it has to be signed

## 2019-09-17 NOTE — Telephone Encounter (Signed)
Sonia Baller from [place] states patient has been non compliant with CPAP usage. She needs to use 4 hours at night for 21 out of 30 days. Schedule office visit to discuss compliant usage. Sonia Baller phone number is [phone].

## 2019-09-17 NOTE — Telephone Encounter (Signed)
Pt says DME won't give pt new supplies, pt claims [facility] wrote a note sayings he is noncompliant. Pt is seeking clarification regarding this matter and her Sleep Study. Please call back at [phone]

## 2019-09-17 NOTE — Telephone Encounter (Signed)
Routing to Dr. Ander Slade to sign sleep study.  Thanks!

## 2019-09-17 NOTE — Telephone Encounter (Signed)
Found report in AO's look-at folder. Report has not been signed by AO yet.   Vallarie Mare, do you know if the report needs to be signed by AO before we can fax it?

## 2019-09-20 ENCOUNTER — Other Ambulatory Visit: Payer: Self-pay

## 2019-09-20 ENCOUNTER — Ambulatory Visit
Admission: RE | Admit: 2019-09-20 | Discharge: 2019-09-20 | Disposition: A | Discharge status: Home / Self Care | Payer: 59 | Source: Ambulatory Visit | Attending: Nurse Practitioner | Admitting: Nurse Practitioner

## 2019-09-20 DIAGNOSIS — Z1231 Encounter for screening mammogram for malignant neoplasm of breast: Secondary | ICD-10-CM

## 2019-09-24 ENCOUNTER — Telehealth: Payer: Self-pay | Admitting: Pulmonary Disease

## 2019-09-24 NOTE — Telephone Encounter (Signed)
Sleep study from 11/09/2017 reviewed  Home sleep test revealing AHI of 15.3  Copy in media section

## 2019-09-26 ENCOUNTER — Other Ambulatory Visit (HOSPITAL_COMMUNITY)
Admission: RE | Admit: 2019-09-26 | Discharge: 2019-09-26 | Disposition: A | Discharge status: Home / Self Care | Payer: 59 | Source: Ambulatory Visit | Attending: Internal Medicine | Admitting: Internal Medicine

## 2019-09-26 DIAGNOSIS — Z01812 Encounter for preprocedural laboratory examination: Secondary | ICD-10-CM | POA: Insufficient documentation

## 2019-09-26 DIAGNOSIS — Z20822 Contact with and (suspected) exposure to covid-19: Secondary | ICD-10-CM | POA: Diagnosis not present

## 2019-09-26 LAB — SARS CORONAVIRUS 2 (TAT 6-24 HRS): SARS Coronavirus 2: NEGATIVE

## 2019-09-30 ENCOUNTER — Ambulatory Visit (HOSPITAL_COMMUNITY): Payer: 59 | Admitting: Registered Nurse

## 2019-09-30 ENCOUNTER — Other Ambulatory Visit: Payer: Self-pay

## 2019-09-30 ENCOUNTER — Ambulatory Visit (HOSPITAL_COMMUNITY)
Admission: RE | Admit: 2019-09-30 | Discharge: 2019-09-30 | Disposition: A | Discharge status: Home / Self Care | Payer: 59 | Attending: Internal Medicine | Admitting: Internal Medicine

## 2019-09-30 ENCOUNTER — Encounter (HOSPITAL_COMMUNITY): Payer: Self-pay | Admitting: Internal Medicine

## 2019-09-30 ENCOUNTER — Encounter (HOSPITAL_COMMUNITY)
Admission: RE | Disposition: A | Discharge status: Home / Self Care | Payer: Self-pay | Source: Home / Self Care | Attending: Internal Medicine

## 2019-09-30 DIAGNOSIS — Z950 Presence of cardiac pacemaker: Secondary | ICD-10-CM | POA: Diagnosis not present

## 2019-09-30 DIAGNOSIS — Z6841 Body Mass Index (BMI) 40.0 and over, adult: Secondary | ICD-10-CM | POA: Diagnosis not present

## 2019-09-30 DIAGNOSIS — D122 Benign neoplasm of ascending colon: Secondary | ICD-10-CM

## 2019-09-30 DIAGNOSIS — G4730 Sleep apnea, unspecified: Secondary | ICD-10-CM | POA: Insufficient documentation

## 2019-09-30 DIAGNOSIS — Z8249 Family history of ischemic heart disease and other diseases of the circulatory system: Secondary | ICD-10-CM | POA: Insufficient documentation

## 2019-09-30 DIAGNOSIS — Z86010 Personal history of colonic polyps: Secondary | ICD-10-CM | POA: Diagnosis present

## 2019-09-30 DIAGNOSIS — K644 Residual hemorrhoidal skin tags: Secondary | ICD-10-CM | POA: Diagnosis not present

## 2019-09-30 DIAGNOSIS — E669 Obesity, unspecified: Secondary | ICD-10-CM | POA: Insufficient documentation

## 2019-09-30 DIAGNOSIS — I10 Essential (primary) hypertension: Secondary | ICD-10-CM | POA: Diagnosis not present

## 2019-09-30 DIAGNOSIS — K648 Other hemorrhoids: Secondary | ICD-10-CM | POA: Diagnosis not present

## 2019-09-30 DIAGNOSIS — Z1211 Encounter for screening for malignant neoplasm of colon: Secondary | ICD-10-CM

## 2019-09-30 DIAGNOSIS — K5730 Diverticulosis of large intestine without perforation or abscess without bleeding: Secondary | ICD-10-CM | POA: Insufficient documentation

## 2019-09-30 HISTORY — PX: COLONOSCOPY WITH PROPOFOL: SHX5780

## 2019-09-30 HISTORY — PX: POLYPECTOMY: SHX5525

## 2019-09-30 SURGERY — COLONOSCOPY WITH PROPOFOL
Anesthesia: Monitor Anesthesia Care

## 2019-09-30 MED ORDER — PROPOFOL 500 MG/50ML IV EMUL
INTRAVENOUS | Status: DC | PRN
  Administered 2019-09-30: 140 ug/kg/min via INTRAVENOUS

## 2019-09-30 MED ORDER — PROPOFOL 10 MG/ML IV BOLUS
INTRAVENOUS | Status: DC | PRN
Start: 2019-09-30 — End: 2019-09-30
  Administered 2019-09-30: 20 mg via INTRAVENOUS

## 2019-09-30 MED ORDER — LACTATED RINGERS IV SOLN
INTRAVENOUS | Status: DC
  Administered 2019-09-30: 1000 mL via INTRAVENOUS

## 2019-09-30 MED ORDER — SODIUM CHLORIDE 0.9 % IV SOLN: INTRAVENOUS | Status: DC

## 2019-09-30 SURGICAL SUPPLY — 21 items

## 2019-09-30 NOTE — Discharge Instructions (Signed)

## 2019-09-30 NOTE — Op Note (Signed)
[facility] Patient Name: Melanie Richard Procedure Date: 09/30/2019 MRN: [id number] Attending MD: Jerene Bears , MD Date of Birth: 26-Feb-1961 CSN: [id number] Age: 59 Admit Type: Outpatient Procedure:                Colonoscopy Indications:              Screening for colorectal malignant neoplasm                            (patient reports prior colonoscopy 6-7 years ago                            with 2 polyps removed though report/path not                            available today) Providers:                Lajuan Lines. Hilarie Fredrickson, MD, Josie Dixon, RN, Corie Chiquito, Technician, Courtney Heys Arm[place], CRNA Referring MD:             Charlene Brooke Nche Medicines:                Monitored Anesthesia Care Complications:            No immediate complications. Estimated Blood Loss:     Estimated blood loss was minimal. Procedure:                Pre-Anesthesia Assessment:                           - Prior to the procedure, a History and Physical                            was performed, and patient medications and                            allergies were reviewed. The patient's tolerance of                            previous anesthesia was also reviewed. The risks                            and benefits of the procedure and the sedation                            options and risks were discussed with the patient.                            All questions were answered, and informed consent                            was obtained. Prior Anticoagulants: The patient has  taken no previous anticoagulant or antiplatelet                            agents. ASA Grade Assessment: III - A patient with                            severe systemic disease. After reviewing the risks                            and benefits, the patient was deemed in                            satisfactory condition to undergo the procedure.                            After obtaining informed consent, the colonoscope                            was passed under direct vision. Throughout the                            procedure, the patient's blood pressure, pulse, and                            oxygen saturations were monitored continuously. The                            CF-HQ190L ([id number]) Olympus colonoscope was                            introduced through the anus and advanced to the                            cecum, identified by appendiceal orifice and                            ileocecal valve. The colonoscopy was performed                            without difficulty. The patient tolerated the                            procedure well. The quality of the bowel                            preparation was excellent. The ileocecal valve,                            appendiceal orifice, and rectum were photographed. Scope In: 10:43:06 AM Scope Out: 11:02:35 AM Scope Withdrawal Time: 0 hours 13 minutes 19 seconds  Total Procedure Duration: 0 hours 19 minutes 29 seconds  Findings:      The digital rectal exam was normal.      A 5 mm polyp was found in the ascending colon. The polyp  was sessile.       The polyp was removed with a cold snare. Resection and retrieval were       complete.      A few small-mouthed diverticula were found in the sigmoid colon.      External and internal hemorrhoids were found during retroflexion. The       hemorrhoids were small. Impression:               - One 5 mm polyp in the ascending colon, removed                            with a cold snare. Resected and retrieved.                           - Diverticulosis in the sigmoid colon.                           - Small hemorrhoids. Moderate Sedation:      N/A Recommendation:           - Patient has a contact number available for                            emergencies. The signs and symptoms of potential                            delayed complications were discussed  with the                            patient. Return to normal activities tomorrow.                            Written discharge instructions were provided to the                            patient.                           - Resume previous diet.                           - Continue present medications.                           - Await pathology results.                           - Repeat colonoscopy is recommended. The                            colonoscopy date will be determined after pathology                            results from today's exam become available for                            review. Procedure Code(s):        ---  Professional ---                           [phone], Colonoscopy, flexible; with removal of                            tumor(s), polyp(s), or other lesion(s) by snare                            technique Diagnosis Code(s):        --- Professional ---                           Z12.11, Encounter for screening for malignant                            neoplasm of colon                           K63.5, Polyp of colon                           K64.8, Other hemorrhoids                           K57.30, Diverticulosis of large intestine without                            perforation or abscess without bleeding CPT copyright 2019 American Medical Association. All rights reserved. The codes documented in this report are preliminary and upon coder review may  be revised to meet current compliance requirements. Jerene Bears, MD 09/30/2019 11:10:07 AM This report has been signed electronically. Number of Addenda: 0

## 2019-09-30 NOTE — Anesthesia Postprocedure Evaluation (Signed)
Anesthesia Post Note  Patient: Melanie Richard  Procedure(s) Performed: COLONOSCOPY WITH PROPOFOL (N/A ) POLYPECTOMY     Patient location during evaluation: PACU Anesthesia Type: MAC Level of consciousness: awake and alert Pain management: pain level controlled Vital Signs Assessment: post-procedure vital signs reviewed and stable Respiratory status: spontaneous breathing, nonlabored ventilation, respiratory function stable and patient connected to nasal cannula oxygen Cardiovascular status: stable and blood pressure returned to baseline Postop Assessment: no apparent nausea or vomiting Anesthetic complications: no   No complications documented.  Last Vitals:  Vitals:   09/30/19 1109 09/30/19 1120  BP: 121/63 117/75  Pulse: 61 62  Resp: (!) 21 20  Temp: 36.5 C   SpO2: 100% 99%    Last Pain:  Vitals:   09/30/19 1120  TempSrc:   PainSc: 0-No pain                 Reyes Fifield DAVID

## 2019-09-30 NOTE — Transfer of Care (Signed)
Immediate Anesthesia Transfer of Care Note  Patient: Melanie Richard  Procedure(s) Performed: COLONOSCOPY WITH PROPOFOL (N/A ) POLYPECTOMY  Patient Location: PACU and Endoscopy Unit  Anesthesia Type:MAC  Level of Consciousness: awake, alert , oriented and patient cooperative  Airway & Oxygen Therapy: Patient Spontanous Breathing and Patient connected to face mask oxygen  Post-op Assessment: Report given to RN, Post -op Vital signs reviewed and stable and Patient moving all extremities  Post vital signs: Reviewed and stable  Last Vitals:  Vitals Value Taken Time  BP    Temp    Pulse    Resp    SpO2      Last Pain:  Vitals:   09/30/19 0836  TempSrc: Oral  PainSc: 0-No pain         Complications: No complications documented.

## 2019-09-30 NOTE — Anesthesia Preprocedure Evaluation (Signed)
Anesthesia Evaluation  Patient identified by MRN, date of birth, ID band Patient awake    Reviewed: Allergy & Precautions, NPO status , Patient's Chart, lab work & pertinent test results  Airway Mallampati: I  TM Distance: >3 FB Neck ROM: Full    Dental   Pulmonary sleep apnea ,    Pulmonary exam normal        Cardiovascular hypertension, Pt. on medications Normal cardiovascular exam     Neuro/Psych    GI/Hepatic   Endo/Other    Renal/GU      Musculoskeletal   Abdominal   Peds  Hematology   Anesthesia Other Findings   Reproductive/Obstetrics                             Anesthesia Physical Anesthesia Plan  ASA: III  Anesthesia Plan: MAC   Post-op Pain Management:    Induction: Intravenous  PONV Risk Score and Plan: 2 and Treatment may vary due to age or medical condition  Airway Management Planned: Nasal Cannula  Additional Equipment:   Intra-op Plan:   Post-operative Plan:   Informed Consent: I have reviewed the patients History and Physical, chart, labs and discussed the procedure including the risks, benefits and alternatives for the proposed anesthesia with the patient or authorized representative who has indicated his/her understanding and acceptance.       Plan Discussed with: CRNA and Surgeon  Anesthesia Plan Comments:         Anesthesia Quick Evaluation

## 2019-09-30 NOTE — H&P (Signed)
HPI: Melanie Richard is a 59 year old female with a reported history of prior colon polyps, hypertension, sleep apnea, sinus node dysfunction status post pacemaker, obesity who presents for outpatient colonoscopy.  She reports she had a colonoscopy "6 or 7 years ago" and she had "2 polyps removed".  She recalls being told to follow-up with repeat colonoscopy in 5 years.  She denies abdominal complaints today.  Reports regular bowel movements.  No blood in stool or melena.  No abdominal pain.  No hepatobiliary or upper GI complaints today.  She tolerated the prep well.  Past Medical History:  Diagnosis Date  . Hypertension   . Sleep apnea    cpap nightly    Past Surgical History:  Procedure Laterality Date  . lymphnode removal    . PACEMAKER IMPLANT    . PACEMAKER PLACEMENT  06/29/2018  . SKIN GRAFT      (Not in an outpatient encounter)   No Known Allergies  Family History  Problem Relation Age of Onset  . Hypertension Mother   . Hypertension Father   . Diabetes Brother   . Intellectual disability Brother   . Cancer Maternal Aunt 40       Breast cancer  . Cancer Maternal Uncle        kidney  . Colon cancer Neg Hx   . Esophageal cancer Neg Hx   . Stomach cancer Neg Hx   . Rectal cancer Neg Hx     Social History   Tobacco Use  . Smoking status: Never Smoker  . Smokeless tobacco: Never Used  Vaping Use  . Vaping Use: Never used  Substance Use Topics  . Alcohol use: Never  . Drug use: Never    ROS: As per history of present illness, otherwise negative  BP (!) 141/57   Pulse 60   Temp 98.5 F (36.9 C) (Oral)   Resp 20   Ht 5' 9.5" (1.765 m)   Wt (!) 158.8 kg   SpO2 96%   BMI 50.94 kg/m  Gen: awake, alert, NAD HEENT: anicteric CV: RRR, no mrg Pulm: CTA b/l Abd: soft, obese, NT/ND, +BS throughout Ext: no c/c/e Neuro: nonfocal   RELEVANT LABS AND IMAGING: CBC    Component Value Date/Time   WBC 7.1 07/23/2019 0856   RBC 5.58 (H) 07/23/2019  0856   HGB 13.2 07/23/2019 0856   HCT 39.9 07/23/2019 0856   PLT 163.0 07/23/2019 0856   MCV 71.6 (L) 07/23/2019 0856   MCH 23.5 (L) 03/15/2019 0126   MCHC 33.2 07/23/2019 0856   RDW 16.5 (H) 07/23/2019 0856   LYMPHSABS 2.1 03/15/2019 0126   MONOABS 0.6 03/15/2019 0126   EOSABS 0.2 03/15/2019 0126   BASOSABS 0.0 03/15/2019 0126    CMP     Component Value Date/Time   NA 138 07/23/2019 0856   K 4.3 07/23/2019 0856   CL 103 07/23/2019 0856   CO2 28 07/23/2019 0856   GLUCOSE 104 (H) 07/23/2019 0856   BUN 17 07/23/2019 0856   CREATININE 0.81 07/23/2019 0856   CALCIUM 9.1 07/23/2019 0856   PROT 6.9 07/23/2019 0856   ALBUMIN 3.9 07/23/2019 0856   AST 11 07/23/2019 0856   ALT 9 07/23/2019 0856   ALKPHOS 69 07/23/2019 0856   BILITOT 0.5 07/23/2019 0856   GFRNONAA >60 03/15/2019 0126   GFRAA >60 03/15/2019 0126    ASSESSMENT/PLAN: 59 year old female with a reported history of prior colon polyps, hypertension, sleep apnea, sinus node dysfunction status post pacemaker, obesity  who presents for outpatient colonoscopy.  1.  Screening/surveillance colonoscopy --patient reports history of prior colon polyps 6 or 7 years ago, likely around age 80 for screening.  She was told to repeat colonoscopy in 5 years.  We will perform colonoscopy today with monitored anesthesia care.  The nature of the procedure, as well as the risks, benefits, and alternatives were carefully and thoroughly reviewed with the patient. Ample time for discussion and questions allowed. The patient understood, was satisfied, and agreed to proceed.

## 2019-10-01 ENCOUNTER — Encounter (HOSPITAL_COMMUNITY): Payer: Self-pay | Admitting: Internal Medicine

## 2019-10-01 LAB — SURGICAL PATHOLOGY

## 2019-10-02 ENCOUNTER — Encounter: Payer: Self-pay | Admitting: Internal Medicine

## 2019-10-17 ENCOUNTER — Encounter: Payer: Self-pay | Admitting: Nurse Practitioner

## 2019-10-17 ENCOUNTER — Other Ambulatory Visit: Payer: Self-pay | Admitting: Nurse Practitioner

## 2019-10-17 DIAGNOSIS — I10 Essential (primary) hypertension: Secondary | ICD-10-CM

## 2019-10-24 ENCOUNTER — Telehealth: Payer: Self-pay

## 2019-10-24 NOTE — Telephone Encounter (Signed)
Charlotte please advise.  Pt called stating that she was seen 07/23/19 an there was a polyp found on her vagina. She was checking up on what she should do about it if someone was suppose to contact her about getting it removed or what.  Pt also stated she needed a TB skin test for her employer done.

## 2019-10-25 NOTE — Telephone Encounter (Signed)
Pt was notified and verbally understood and scheduled her TB skin test for Tuesday 10/29/19 at 9:20am.

## 2019-10-25 NOTE — Telephone Encounter (Signed)
Left pt a voice mail to call back

## 2019-10-25 NOTE — Telephone Encounter (Signed)
These are typically benign and we just monitor to any change during her next CPE.  Ok to place PPD next week

## 2019-10-29 ENCOUNTER — Ambulatory Visit (INDEPENDENT_AMBULATORY_CARE_PROVIDER_SITE_OTHER): Payer: 59

## 2019-10-29 ENCOUNTER — Other Ambulatory Visit: Payer: Self-pay

## 2019-10-29 DIAGNOSIS — Z111 Encounter for screening for respiratory tuberculosis: Secondary | ICD-10-CM

## 2019-10-29 NOTE — Progress Notes (Signed)
Pt came to have TB skin placement on her left right upper forearm, pt tolerated the placement well and scheduled to be read Thursday.

## 2019-10-31 ENCOUNTER — Ambulatory Visit: Payer: 59

## 2019-10-31 DIAGNOSIS — Z111 Encounter for screening for respiratory tuberculosis: Secondary | ICD-10-CM

## 2019-10-31 LAB — TB SKIN TEST
Induration: 0 mm
TB Skin Test: NEGATIVE

## 2019-11-11 ENCOUNTER — Encounter: Payer: Self-pay | Admitting: Pulmonary Disease

## 2019-11-11 ENCOUNTER — Ambulatory Visit (INDEPENDENT_AMBULATORY_CARE_PROVIDER_SITE_OTHER): Payer: 59 | Admitting: Nurse Practitioner

## 2019-11-11 ENCOUNTER — Ambulatory Visit (INDEPENDENT_AMBULATORY_CARE_PROVIDER_SITE_OTHER): Payer: 59 | Admitting: Pulmonary Disease

## 2019-11-11 ENCOUNTER — Other Ambulatory Visit: Payer: Self-pay

## 2019-11-11 ENCOUNTER — Encounter: Payer: Self-pay | Admitting: Nurse Practitioner

## 2019-11-11 VITALS — BP 136/78 | HR 60 | Temp 97.9°F | Ht 69.5 in | Wt 364.4 lb

## 2019-11-11 VITALS — BP 120/76 | HR 76 | Temp 97.7°F | Ht 69.0 in | Wt 364.0 lb

## 2019-11-11 DIAGNOSIS — Z6841 Body Mass Index (BMI) 40.0 and over, adult: Secondary | ICD-10-CM | POA: Diagnosis not present

## 2019-11-11 DIAGNOSIS — L811 Chloasma: Secondary | ICD-10-CM

## 2019-11-11 DIAGNOSIS — E8881 Metabolic syndrome: Secondary | ICD-10-CM | POA: Diagnosis not present

## 2019-11-11 DIAGNOSIS — E669 Obesity, unspecified: Secondary | ICD-10-CM | POA: Insufficient documentation

## 2019-11-11 DIAGNOSIS — G4733 Obstructive sleep apnea (adult) (pediatric): Secondary | ICD-10-CM | POA: Diagnosis not present

## 2019-11-11 NOTE — Patient Instructions (Signed)
Go to lab for blood draw.  Skin discoloration is called Melasma. I recommend evaluation by dermatology before making medication changes. You will be contacted to schedule appt with dermatology and weight loss clinic.   Melasma Melasma is a skin condition that causes areas of darker coloring. It usually appears in patches on the cheeks, forehead, upper lip, and neck. These patches can look like a mask. The discolored areas do not itch and are not red or swollen. Melasma is not contagious. This means that it does not spread from person to person. What are the causes? The cause of this condition is not known. However, it can be started by certain triggers, such as:  Being out in the sun.  Allergies to medicines or cosmetics.  Changes in your hormones, such as: ? Taking birth control medicines. ? Taking hormone replacement therapy. ? Being pregnant. What increases the risk? The following factors may make you more likely to develop this condition:  Being a woman. Melasma is less common in men.  Having a family history of melasma.  Having darker skin.  Living in a tropical climate. What are the signs or symptoms? The only symptom of this condition is dark or tan patches on the skin. How is this diagnosed? This condition is diagnosed based on:  A physical exam. Your health care provider will examine the physical appearance of your skin. He or she may use an ultraviolet light, called a Wood lamp, to look more closely at your skin.  Biopsy. A small sample of your skin is taken and examined under a microscope. This is done to make sure your melasma is not caused by another skin condition, such as skin cancer. How is this treated? There is no cure for this condition. However, there are treatments that may lighten the color of the darker patches. Treatment may include:  Medicines, such as bleaching or steroid creams.  Facial or chemical peels.  Laser treatment.  Dermabrasion or  microdermabrasion. These procedures use fine instruments to scrape and remove the outer layer of skin in order to grow new, healthy-looking skin. Your melasma may also go away on its own over time. Follow these instructions at home:  Lifestyle  Avoid overexposure to the sun, especially in tropical areas.  Wear sunscreen with an SPF of 30 or higher every day.  Wear a hat that protects your face from the sun.  Use gentle cosmetics that are meant for sensitive skin.  Do not use wax to remove excess hair in areas where you have or have had melasma. General instructions  Take or apply over-the-counter and prescription medicines only as told by your health care provider.  Keep all follow-up visits as told by your health care provider. This is important. Contact a health care provider if:  You have new symptoms.  Your symptoms get worse.  Your affected skin areas are: ? Bleeding. ? Irritated. Summary  Melasma is a skin condition that causes areas of darker coloring that do not itch and are not red or swollen.  The cause of this condition is not known. However, it can be started by certain triggers such as sun exposure, allergies to medicines or cosmetics, or changes in your hormones.  Risk factors include being a woman, having a family history of melasma, having darker skin, or living in a tropical climate.  There is no cure for this condition. However, there are treatments that may lighten the color of the darker patches. They include medicine, facial or  chemical peels, laser treatment, dermabrasion, or microdermabrasion. This information is not intended to replace advice given to you by your health care provider. Make sure you discuss any questions you have with your health care provider. Document Revised: 04/17/2017 Document Reviewed: 04/17/2017 Elsevier Patient Education  2020 [employer].

## 2019-11-11 NOTE — Patient Instructions (Signed)
Your compliance data noted  We will send a prescription to [facility] for CPAP supplies  Follow-up in 6 months

## 2019-11-11 NOTE — Assessment & Plan Note (Addendum)
inorder to promote long term weight loss, I recommended referral to weight loss clinic and use of GLP-1 injection (if covered by insurance) in place of qsymia/phentermine. This is due to long term benefits of GLP-1. I also encourage to decrease calorie intake and daily walking/wateraerobic/stationery bicycle for exercise. No personal hx or FHx of thyriod/pancreatic cancer. No Hx of cholelithiasis or pancreatitis  HgbA1c and insulin level indicates prediabetes. You will benefit from liraglutide injection to improve glucose metabolism. New rx sent. Pending vit. D Entered referral to weight loss clinic. F/up in 7month

## 2019-11-11 NOTE — Progress Notes (Signed)
Melanie Richard    [id number]    September 21, 1960  Primary Care Physician:Nche, Charlene Brooke, NP  Referring Physician: Flossie Buffy, NP [address] 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 Will usually sleep between 4 and after 8 hours She states she has been using her machine regularly  Her work shifts does aff thank you act sleep and hours of machine use Was following up with a sleep physician in [place] but relocated  Usually goes to bed about 12 noon About 30 minutes to fall asleep 2-3 awakenings  Weight has been relatively stable since her last visit Ability to sleep during the day depends on work schedule  History of hypertension, irregular heartbeat with a pacemaker Blood clots in 2013  She feels she is doing relatively well   Outpatient Encounter Medications as of 11/11/2019  Medication Sig  . aspirin 81 MG EC tablet Take 81 mg by mouth daily.   . hydrochlorothiazide (HYDRODIURIL) 12.5 MG tablet Take 1 tablet (12.5 mg total) by mouth daily. Needs office visit for additional refills  . lisinopril (ZESTRIL) 5 MG tablet Take 1 tablet (5 mg total) by mouth daily.   No facility-administered encounter medications on file as of 11/11/2019.    Allergies as of 11/11/2019  . (No Known Allergies)    Past Medical History:  Diagnosis Date  . Hypertension   . Sleep apnea    cpap nightly    Past Surgical History:  Procedure Laterality Date  . COLONOSCOPY WITH PROPOFOL N/A 09/30/2019   Procedure: COLONOSCOPY WITH PROPOFOL;  Surgeon: Jerene Bears, MD;  Location: [facility];  Service: Gastroenterology;  Laterality: N/A;  . lymphnode removal    . PACEMAKER IMPLANT    . PACEMAKER PLACEMENT  06/29/2018  . POLYPECTOMY  09/30/2019   Procedure:  POLYPECTOMY;  Surgeon: Jerene Bears, MD;  Location: [facility];  Service: Gastroenterology;;  . SKIN GRAFT      Family History  Problem Relation Age of Onset  . Hypertension Mother   . Hypertension Father   . Diabetes Brother   . Intellectual disability Brother   . Cancer Maternal Aunt 40       Breast cancer  . Cancer Maternal Uncle        kidney  . Colon cancer Neg Hx   . Esophageal cancer Neg Hx   . Stomach cancer Neg Hx   . Rectal cancer Neg Hx     Social History   Socioeconomic History  . Marital status: Divorced    Spouse name: Not on file  . Number of children: 2  . Years of education: Not on file  . Highest education level: Not on file  Occupational History  . Not on file  Tobacco Use  . Smoking status: Never Smoker  . Smokeless tobacco: Never Used  Vaping Use  . Vaping Use: Never used  Substance and Sexual Activity  . Alcohol use: Never  . Drug use: Never  . Sexual activity: Yes    Birth control/protection: Post-menopausal  Other Topics Concern  . Not on file  Social History Narrative  . Not on file   Social Determinants of Health   Financial Resource Strain:   . Difficulty of Paying Living Expenses:  Food Insecurity:   . Worried About Charity fundraiser in the Last Year:   . Arboriculturist in the Last Year:   Transportation Needs:   . Film/video editor (Medical):   Marland Kitchen Lack of Transportation (Non-Medical):   Physical Activity:   . Days of Exercise per Week:   . Minutes of Exercise per Session:   Stress:   . Feeling of Stress :   Social Connections:   . Frequency of Communication with Friends and Family:   . Frequency of Social Gatherings with Friends and Family:   . Attends Religious Services:   . Active Member of Clubs or Organizations:   . Attends Archivist Meetings:   Marland Kitchen Marital Status:   Intimate Partner Violence:   . Fear of Current or Ex-Partner:   . Emotionally Abused:   Marland Kitchen Physically Abused:   . Sexually Abused:      Review of Systems  Constitutional: Negative.   HENT: Negative.   Respiratory: Positive for apnea and shortness of breath.   Psychiatric/Behavioral: Negative for sleep disturbance.    Vitals:   11/11/19 1103  BP: (!) 136/78  Pulse: 60  Temp: 97.9 F (36.6 C)  SpO2: 99%     Physical Exam Constitutional:      Appearance: She is well-developed.     Comments: Obese  HENT:     Head: Normocephalic.     Mouth/Throat:     Mouth: Mucous membranes are moist.  Eyes:     General:        Right eye: No discharge.        Left eye: No discharge.     Pupils: Pupils are equal, round, and reactive to light.  Neck:     Thyroid: No thyromegaly.     Trachea: No tracheal deviation.  Cardiovascular:     Rate and Rhythm: Normal rate and regular rhythm.     Pulses: Normal pulses.     Heart sounds: Normal heart sounds. No murmur heard.  No friction rub.  Pulmonary:     Effort: Pulmonary effort is normal. No respiratory distress.     Breath sounds: Normal breath sounds. No wheezing.  Chest:     Chest wall: No tenderness.  Neurological:     Mental Status: She is alert.    Epworth Sleepiness Scale of 8  Data Reviewed: Download reveals 87% compliance Average total usage of 3 hours 54 minutes Average usage on days used is 4 hours 30 minutes Auto CPAP 4-20 residual AHI of 0.9  Assessment:  Moderate obstructive sleep apnea -She is compliant with CPAP use -Hours of usage is affected by work schedule and the number of hours she is able to sleep during the day -She is benefiting from using CPAP -We will continue to benefit from using CPAP -Risk of not continuing to use CPAP is significant  Morbid obesity -Encouraged to continue working on weight loss efforts  Plan/Recommendations: Encouraged to use CPAP on a regular basis  Will send the prescription to [facility] for CPAP supplies  Encouraged adequate number of hours of sleep 6 to 8 hours-this is affected by her ability to sleep  during the day and work schedule  We will follow up in 6 months  She is to call with any significant concerns   Sherrilyn Rist MD [facility] 11/11/2019, 11:23 AM  CC: Nche, Charlene Brooke, NP

## 2019-11-11 NOTE — Progress Notes (Signed)
Subjective:  Patient ID: Melanie Richard, female    DOB: 1960-08-24  Age: 59 y.o. MRN: [id number]  CC: Advice Only (discuss midication reations and weight loss. )  HPI  Weight management: Reports increased appetite with stress. Has lost 30lbs in past vis low calorie and low cab diet. Will like to medication to help suppress appetite and promote weight loss. No exercise regimen Hx of OSA with CPAP use. Wt Readings from Last 3 Encounters:  11/11/19 (!) 364 lb (165.1 kg)  11/11/19 (!) 364 lb 6.4 oz (165.3 kg)  09/23/19 (!) 350 lb (158.8 kg)   Rash on face: Present for 1year, hyperpigmentation on forehead and cheek, no itching, denies change in personal hygiene products, she think this might be related to lisinopril.  Reviewed past Medical, Social and Family history today.  Outpatient Medications Prior to Visit  Medication Sig Dispense Refill   aspirin 81 MG EC tablet Take 81 mg by mouth daily.      hydrochlorothiazide (HYDRODIURIL) 12.5 MG tablet Take 1 tablet (12.5 mg total) by mouth daily. Needs office visit for additional refills 90 tablet 1   lisinopril (ZESTRIL) 5 MG tablet Take 1 tablet (5 mg total) by mouth daily. 90 tablet 1   No facility-administered medications prior to visit.    ROS See HPI  Objective:  BP 120/76    Pulse 76    Temp 97.7 F (36.5 C) (Tympanic)    Ht 5\' 9"  (1.753 m)    Wt (!) 364 lb (165.1 kg)    SpO2 95%    BMI 53.75 kg/m   Physical Exam Constitutional:      Appearance: She is obese.  Cardiovascular:     Rate and Rhythm: Normal rate.     Pulses: Normal pulses.  Neurological:     Mental Status: She is alert and oriented to person, place, and time.    Assessment & Plan:  This visit occurred during the SARS-CoV-2 public health emergency.  Safety protocols were in place, including screening questions prior to the visit, additional usage of staff PPE, and extensive cleaning of exam room while observing appropriate contact time as indicated for  disinfecting solutions.   Melanie Richard was seen today for advice only.  Diagnoses and all orders for this visit:  Class 3 severe obesity due to excess calories with serious comorbidity and body mass index (BMI) of 50.0 to 59.9 in adult (HCC) -     Hemoglobin A1c -     Insulin, random -     Vitamin D 1,25 dihydroxy -     Amb Ref to Medical Weight Management -     liraglutide (VICTOZA) 18 MG/3ML SOPN; Inject 0.1 mLs (0.6 mg total) into the skin daily for 14 days, THEN 0.2 mLs (1.2 mg total) daily for 14 days. -     Insulin Pen Needle (PEN NEEDLES) 31G X 6 MM MISC; 1 application by Does not apply route daily. For victoza pen  Melasma -     Ambulatory referral to Dermatology  Insulin resistance -     liraglutide (VICTOZA) 18 MG/3ML SOPN; Inject 0.1 mLs (0.6 mg total) into the skin daily for 14 days, THEN 0.2 mLs (1.2 mg total) daily for 14 days. -     Insulin Pen Needle (PEN NEEDLES) 31G X 6 MM MISC; 1 application by Does not apply route daily. For victoza pen    Problem List Items Addressed This Visit      Musculoskeletal and Integument   Melasma  Relevant Orders   Ambulatory referral to Dermatology     Other   Class 3 severe obesity due to excess calories with serious comorbidity and body mass index (BMI) of 50.0 to 59.9 in adult [facility]) - Primary    inorder to promote long term weight loss, I recommended referral to weight loss clinic and use of GLP-1 injection (if covered by insurance) in place of qsymia/phentermine. This is due to long term benefits of GLP-1. I also encourage to decrease calorie intake and daily walking/wateraerobic/stationery bicycle for exercise. No personal hx or FHx of thyriod/pancreatic cancer. No Hx of cholelithiasis or pancreatitis  HgbA1c and insulin level indicates prediabetes. You will benefit from liraglutide injection to improve glucose metabolism. New rx sent. Pending vit. D Entered referral to weight loss clinic. F/up in 84month      Relevant  Medications   liraglutide (VICTOZA) 18 MG/3ML SOPN   Insulin Pen Needle (PEN NEEDLES) 31G X 6 MM MISC   Other Relevant Orders   Hemoglobin A1c (Completed)   Insulin, random (Completed)   Vitamin D 1,25 dihydroxy   Amb Ref to Medical Weight Management    Other Visit Diagnoses    Insulin resistance       Relevant Medications   liraglutide (VICTOZA) 18 MG/3ML SOPN   Insulin Pen Needle (PEN NEEDLES) 31G X 6 MM MISC      Follow-up: Return if symptoms worsen or fail to improve.  Wilfred Lacy, NP

## 2019-11-12 LAB — HEMOGLOBIN A1C: Hgb A1c MFr Bld: 6 % (ref 4.6–6.5)

## 2019-11-14 MED ORDER — LIRAGLUTIDE 18 MG/3ML ~~LOC~~ SOPN: PEN_INJECTOR | SUBCUTANEOUS | 1 refills | Status: DC

## 2019-11-14 MED ORDER — PEN NEEDLES 31G X 6 MM MISC: 1.0000 "application " | Freq: Every day | 5 refills | Status: DC

## 2019-11-15 LAB — VITAMIN D 1,25 DIHYDROXY
Vitamin D 1, 25 (OH)2 Total: 32 pg/mL (ref 18–72)
Vitamin D2 1, 25 (OH)2: 9 pg/mL
Vitamin D3 1, 25 (OH)2: 23 pg/mL

## 2019-11-15 LAB — INSULIN, RANDOM: Insulin: 47.9 u[IU]/mL — ABNORMAL HIGH

## 2019-11-25 ENCOUNTER — Ambulatory Visit (INDEPENDENT_AMBULATORY_CARE_PROVIDER_SITE_OTHER): Payer: 59 | Admitting: Bariatrics

## 2019-11-25 ENCOUNTER — Encounter (INDEPENDENT_AMBULATORY_CARE_PROVIDER_SITE_OTHER): Payer: Self-pay | Admitting: Bariatrics

## 2019-11-25 ENCOUNTER — Other Ambulatory Visit: Payer: Self-pay

## 2019-11-25 VITALS — BP 120/85 | HR 60 | Temp 98.4°F | Ht 69.0 in | Wt 356.0 lb

## 2019-11-25 DIAGNOSIS — Z9189 Other specified personal risk factors, not elsewhere classified: Secondary | ICD-10-CM

## 2019-11-25 DIAGNOSIS — I10 Essential (primary) hypertension: Secondary | ICD-10-CM

## 2019-11-25 DIAGNOSIS — R5383 Other fatigue: Secondary | ICD-10-CM | POA: Diagnosis not present

## 2019-11-25 DIAGNOSIS — Z0289 Encounter for other administrative examinations: Secondary | ICD-10-CM

## 2019-11-25 DIAGNOSIS — R0602 Shortness of breath: Secondary | ICD-10-CM

## 2019-11-25 DIAGNOSIS — Z1331 Encounter for screening for depression: Secondary | ICD-10-CM | POA: Diagnosis not present

## 2019-11-25 DIAGNOSIS — R7303 Prediabetes: Secondary | ICD-10-CM | POA: Diagnosis not present

## 2019-11-25 DIAGNOSIS — G4739 Other sleep apnea: Secondary | ICD-10-CM | POA: Diagnosis not present

## 2019-11-25 DIAGNOSIS — E559 Vitamin D deficiency, unspecified: Secondary | ICD-10-CM | POA: Diagnosis not present

## 2019-11-25 DIAGNOSIS — Z6841 Body Mass Index (BMI) 40.0 and over, adult: Secondary | ICD-10-CM

## 2019-11-25 DIAGNOSIS — R001 Bradycardia, unspecified: Secondary | ICD-10-CM

## 2019-11-25 MED ORDER — VITAMIN D (ERGOCALCIFEROL) 1.25 MG (50000 UNIT) PO CAPS: 50000.0000 [IU] | ORAL_CAPSULE | ORAL | 0 refills | Status: DC

## 2019-11-26 ENCOUNTER — Encounter (INDEPENDENT_AMBULATORY_CARE_PROVIDER_SITE_OTHER): Payer: Self-pay | Admitting: Bariatrics

## 2019-11-26 NOTE — Progress Notes (Signed)
Dear Wilfred Lacy, NP,   Thank you for referring Melanie Richard to our clinic. The following note includes my evaluation and treatment recommendations.  Chief Complaint:   OBESITY Melanie Richard (MR# [id number]) is a 59 y.o. female who presents for evaluation and treatment of obesity and related comorbidities. Current BMI is Body mass index is 52.57 kg/m.Marland Kitchen Melanie Richard has been struggling with her weight for many years and has been unsuccessful in either losing weight, maintaining weight loss, or reaching her healthy weight goal.  Melanie Richard is currently in the action stage of change and ready to dedicate time achieving and maintaining a healthier weight. Melanie Richard is interested in becoming our patient and working on intensive lifestyle modifications including (but not limited to) diet and exercise for weight loss.  Melanie Richard likes to [employer]. She craves meat and starches. She snacks after dinner. She works night shift.  Melanie Richard habits were reviewed today and are as follows: Her family eats meals together, she thinks her family will eat healthier with her, her desired weight loss is 200 lbs, she has been heavy most of her life, she started gaining weight after her 2nd child was born, her heaviest weight ever was 364 pounds, she craves fish/shriimp/steak/baked potatoes, she snacks frequently in the evenings, she skips breakfast everyday, she sometimes makes poor food choices, she has problems with excessive hunger, she frequently eats larger portions than normal, she has binge eating behaviors and she struggles with emotional eating.  Depression Screen Melanie Richard Food and Mood (modified PHQ-9) score was 15.  Depression screen PHQ 2/9 11/25/2019  Decreased Interest 2  Down, Depressed, Hopeless 1  PHQ - 2 Score 3  Altered sleeping 3  Tired, decreased energy 3  Change in appetite 1  Feeling bad or failure about yourself  2  Trouble concentrating 1  Moving slowly or fidgety/restless 2    Suicidal thoughts 0  PHQ-9 Score 15  Difficult doing work/chores Not difficult at all   Subjective:   Other fatigue. Melanie Richard admits to daytime somnolence. Patent has a history of symptoms of Epworth sleepiness scale. Melanie Richard generally gets 3-4 hours of sleep per night, and states that she has generally restful sleep. Snoring is present. Apneic episodes are not present. Epworth Sleepiness Score is 16.  SOB (shortness of breath) on exertion. Melanie Richard notes increasing shortness of breath with certain activities and seems to be worsening over time with weight gain. She notes getting out of breath sooner with activity than she used to. This has gotten worse recently. [name] denies shortness of breath at rest or orthopnea.  Essential hypertension. [name] is taking HCTZ and lisinopril. Blood pressure is reasonably well controlled.  BP Readings from Last 3 Encounters:  11/25/19 120/85  11/11/19 120/76  11/11/19 (!) 136/78   Lab Results  Component Value Date   CREATININE 0.81 07/23/2019   CREATININE 0.89 03/15/2019   Other sleep apnea. Melanie Richard wears CPAP. She reports only getting 3.5 hours of sleep.  Bradycardia. Melanie Richard sees a cardiologist. 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  Pre-diabetes. Melanie Richard has a diagnosis of prediabetes based on her elevated HgA1c and was informed this puts her at greater risk of developing diabetes. She continues to work on diet and exercise to decrease her risk of diabetes. She denies hypoglycemia. Appetite varies. Insulin 47.9.  Lab Results  Component Value Date   HGBA1C 6.0 11/11/2019   Vitamin D deficiency. Melanie Richard is not on Vitamin D supplementation.   Depression screening. Melanie Richard had a strongly positive depression screen with  a PHQ-9 score of 15.  At risk for activity intolerance. [name] is at risk of exercise intolerance due to obesity and pacemaker.  Assessment/Plan:   Other fatigue. Melanie Richard does feel that her  weight is causing her energy to be lower than it should be. Fatigue may be related to obesity, depression or many other causes. Labs will be ordered, and in the meanwhile, [name] will focus on self care including making healthy food choices, increasing physical activity and focusing on stress reduction. EKG 12-Lead performed today.  SOB (shortness of breath) on exertion. [name] does feel that she gets out of breath more easily that she used to when she exercises. Melanie Richard shortness of breath appears to be obesity related and exercise induced. She has agreed to work on weight loss and gradually increase exercise to treat her exercise induced shortness of breath. Will continue to monitor closely.  Essential hypertension. Melanie Richard is working on healthy weight loss and exercise to improve blood pressure control. We will watch for signs of hypotension as she continues her lifestyle modifications. She will continue her medications as directed.   Other sleep apnea. Intensive lifestyle modifications are the first line treatment for this issue. We discussed several lifestyle modifications today and she will continue to work on diet, exercise and weight loss efforts. We will continue to monitor. Orders and follow up as documented in patient record. Melanie Richard will continue her use of CPAP as directed. She will increase her sleep to 6 hours, and preferably 7-8 hours.  Counseling  Sleep apnea is a condition in which breathing pauses or becomes shallow during sleep. This happens over and over during the night. This disrupts your sleep and keeps your body from getting the rest that it needs, which can cause tiredness and lack of energy (fatigue) during the day.  Sleep apnea treatment: If you were given a device to open your airway while you sleep, USE IT!  Sleep hygiene:   Limit or avoid alcohol, caffeinated beverages, and cigarettes, especially close to bedtime.   Do not eat a large meal or eat spicy  foods right before bedtime. This can lead to digestive discomfort that can make it hard for you to sleep.  Keep a sleep diary to help you and your health care provider figure out what could be causing your insomnia.  . Make your bedroom a dark, comfortable place where it is easy to fall asleep. ? Put up shades or blackout curtains to block light from outside. ? Use a white noise machine to block noise. ? Keep the temperature cool. . Limit screen use before bedtime. This includes: ? Watching TV. ? Using your smartphone, tablet, or computer. . Stick to a routine that includes going to bed and waking up at the same times every day and night. This can help you fall asleep faster. Consider making a quiet activity, such as reading, part of your nighttime routine. . Try to avoid taking naps during the day so that you sleep better at night. . Get out of bed if you are still awake after 15 minutes of trying to sleep. Keep the lights down, but try reading or doing a quiet activity. When you feel sleepy, go back to bed.  Bradycardia. Melanie Richard will follow-up with her cardiologist every 6 months.  Pre-diabetes. Melanie Richard will continue to work on weight loss, exercise, increasing healthy fats and protein, and decreasing simple carbohydrates to help decrease the risk of diabetes.   Vitamin D deficiency. Low Vitamin D level contributes  to fatigue and are associated with obesity, breast, and colon cancer. She was given a prescription for Vitamin D, Ergocalciferol, (DRISDOL) 1.25 MG (50000 UNIT) CAPS capsule every week #4 with 0 refills and will follow-up for routine testing of Vitamin D, at least 2-3 times per year to avoid over-replacement.   Depression screening. Melanie Richard had a positive depression screening. Depression is commonly associated with obesity and often results in emotional eating behaviors. We will monitor this closely and work on CBT to help improve the non-hunger eating patterns. Referral to  Psychology may be required if no improvement is seen as she continues in our clinic.  At risk for activity intolerance. Melanie Richard was given approximately 15 minutes of exercise intolerance counseling today. She is 59 y.o. female and has risk factors exercise intolerance including obesity. We discussed intensive lifestyle modifications today with an emphasis on specific weight loss instructions and strategies. Melanie Richard will slowly increase activity as tolerated.  Repetitive spaced learning was employed today to elicit superior memory formation and behavioral change.  Class 3 severe obesity with serious comorbidity and body mass index (BMI) of 50.0 to 59.9 in adult, unspecified obesity type ([place]).  Melanie Richard is currently in the action stage of change and her goal is to continue with weight loss efforts. I recommend Ayala begin the structured treatment plan as follows:  She has agreed to the Category 4 Plan.  She will work on meal planning, intentional eating, and increasing her water intake to at least 64 oz daily.   Exercise goals: All adults should avoid inactivity. Some physical activity is better than none, and adults who participate in any amount of physical activity gain some health benefits.   Behavioral modification strategies: increasing lean protein intake, decreasing simple carbohydrates, increasing vegetables, increasing water intake, decreasing eating out, no skipping meals, meal planning and cooking strategies, keeping healthy foods in the home and planning for success.  She was informed of the importance of frequent follow-up visits to maximize her success with intensive lifestyle modifications for her multiple health conditions. She was informed we would discuss her lab results at her next visit unless there is a critical issue that needs to be addressed sooner. Melanie Richard agreed to keep her next visit at the agreed upon time to discuss these results.  Objective:   Blood  pressure 120/85, pulse 60, temperature 98.4 F (36.9 C), height 5\' 9"  (1.753 m), weight (!) 356 lb (161.5 kg), SpO2 97 %. Body mass index is 52.57 kg/m.  EKG: Sinus  Rhythm with a rate of 60 BPM. Nonspecific T-abnormality. Otherwise normal.  Indirect Calorimeter completed today shows a VO2 of 378 and a REE of 2635.  Her calculated basal metabolic rate is 1093 thus her basal metabolic rate is better than expected.  General: Cooperative, alert, well developed, in no acute distress. HEENT: Conjunctivae and lids unremarkable. Cardiovascular: Regular rhythm.  Lungs: Normal work of breathing. Neurologic: No focal deficits.   Lab Results  Component Value Date   CREATININE 0.81 07/23/2019   BUN 17 07/23/2019   NA 138 07/23/2019   K 4.3 07/23/2019   CL 103 07/23/2019   CO2 28 07/23/2019   Lab Results  Component Value Date   ALT 9 07/23/2019   AST 11 07/23/2019   ALKPHOS 69 07/23/2019   BILITOT 0.5 07/23/2019   Lab Results  Component Value Date   HGBA1C 6.0 11/11/2019   No results found for: INSULIN Lab Results  Component Value Date   TSH 0.67 07/23/2019  Lab Results  Component Value Date   CHOL 174 07/23/2019   HDL 44.20 07/23/2019   LDLCALC 107 (H) 07/23/2019   TRIG 114.0 07/23/2019   CHOLHDL 4 07/23/2019   Lab Results  Component Value Date   WBC 7.1 07/23/2019   HGB 13.2 07/23/2019   HCT 39.9 07/23/2019   MCV 71.6 (L) 07/23/2019   PLT 163.0 07/23/2019   No results found for: IRON, TIBC, FERRITIN  Attestation Statements:   Reviewed by clinician on day of visit: allergies, medications, problem list, medical history, surgical history, family history, social history, and previous encounter notes.  Migdalia Dk, am acting as Location manager for [employer], DO   I have reviewed the above documentation for accuracy and completeness, and I agree with the above. Jearld Lesch, DO

## 2019-12-04 ENCOUNTER — Telehealth: Payer: Self-pay | Admitting: Pulmonary Disease

## 2019-12-04 DIAGNOSIS — G4733 Obstructive sleep apnea (adult) (pediatric): Secondary | ICD-10-CM

## 2019-12-04 NOTE — Telephone Encounter (Signed)
Patient requesting CPAP supplies from [facility], her insurance does not work with [facility]. DME order placed.

## 2019-12-09 ENCOUNTER — Ambulatory Visit (INDEPENDENT_AMBULATORY_CARE_PROVIDER_SITE_OTHER): Payer: 59 | Admitting: Bariatrics

## 2019-12-10 ENCOUNTER — Other Ambulatory Visit: Payer: Self-pay | Admitting: *Deleted

## 2019-12-10 ENCOUNTER — Ambulatory Visit (INDEPENDENT_AMBULATORY_CARE_PROVIDER_SITE_OTHER): Payer: 59 | Admitting: Bariatrics

## 2019-12-10 ENCOUNTER — Other Ambulatory Visit: Payer: Self-pay

## 2019-12-10 ENCOUNTER — Encounter (INDEPENDENT_AMBULATORY_CARE_PROVIDER_SITE_OTHER): Payer: Self-pay | Admitting: Bariatrics

## 2019-12-10 ENCOUNTER — Other Ambulatory Visit: Payer: 59

## 2019-12-10 VITALS — BP 126/82 | HR 61 | Temp 98.1°F | Ht 66.0 in | Wt 360.0 lb

## 2019-12-10 DIAGNOSIS — E559 Vitamin D deficiency, unspecified: Secondary | ICD-10-CM | POA: Diagnosis not present

## 2019-12-10 DIAGNOSIS — Z9189 Other specified personal risk factors, not elsewhere classified: Secondary | ICD-10-CM

## 2019-12-10 DIAGNOSIS — Z6841 Body Mass Index (BMI) 40.0 and over, adult: Secondary | ICD-10-CM

## 2019-12-10 DIAGNOSIS — R7303 Prediabetes: Secondary | ICD-10-CM

## 2019-12-10 DIAGNOSIS — Z20822 Contact with and (suspected) exposure to covid-19: Secondary | ICD-10-CM

## 2019-12-10 MED ORDER — VITAMIN D (ERGOCALCIFEROL) 1.25 MG (50000 UNIT) PO CAPS: 50000.0000 [IU] | ORAL_CAPSULE | ORAL | 0 refills | Status: DC

## 2019-12-10 MED ORDER — METFORMIN HCL 500 MG PO TABS: 500.0000 mg | ORAL_TABLET | Freq: Every day | ORAL | 0 refills | Status: DC

## 2019-12-10 NOTE — Progress Notes (Signed)
Chief Complaint:   OBESITY Melanie Richard is here to discuss her progress with her obesity treatment plan along with follow-up of her obesity related diagnoses. Melanie Richard is on the Category 4 Plan and states she is following her eating plan approximately 80% of the time. Melanie Richard states she is walking 10 minutes 3 times per week.  Today's visit was #: 2 Starting weight: 356 lbs Starting date: 11/25/2019 Today's weight: 360 lbs Today's date: 12/10/2019 Total lbs lost to date: 0 Total lbs lost since last in-office visit: 0  Interim History: Melanie Richard is up 4 lbs. She states she ate more carbohydrates yesterday.  Subjective:   Vitamin D deficiency. Melanie Richard was started on Vitamin D at her last visit.   Ref. Range 11/11/2019 15:12  Vitamin D3 1, 25 (OH) Latest Units: pg/mL 23   Pre-diabetes. Melanie Richard has a diagnosis of prediabetes based on her elevated HgA1c and was informed this puts her at greater risk of developing diabetes. She continues to work on diet and exercise to decrease her risk of diabetes. She denies nausea or hypoglycemia.  No polyphagia. Insulin 47.9.  Lab Results  Component Value Date   HGBA1C 6.0 11/11/2019   No results found for: INSULIN  At risk of diabetes mellitus. Melanie Richard is at higher than average risk for developing diabetes due to prediabetes.  Assessment/Plan:   Vitamin D deficiency. Low Vitamin D level contributes to fatigue and are associated with obesity, breast, and colon cancer. She was given a prescription for Vitamin D, Ergocalciferol, (DRISDOL) 1.25 MG (50000 UNIT) CAPS capsule every week #4 with 0 refills and will follow-up for routine testing of Vitamin D, at least 2-3 times per year to avoid over-replacement.   Pre-diabetes. Melanie Richard will continue to work on weight loss, exercise, increasing healthy fats and protein, and decreasing simple carbohydrates to help decrease the risk of diabetes. Handouts were provided on Prediabetes and  Metformin. Prescription was given for metFORMIN (GLUCOPHAGE) 500 MG tablet 1 with meal #30 with 0 refills.  At risk of diabetes mellitus. Melanie Richard was given approximately 10 minutes of diabetes education and counseling today. We discussed intensive lifestyle modifications today with an emphasis on weight loss as well as increasing exercise and decreasing simple carbohydrates in her diet. We also reviewed medication options with an emphasis on risk versus benefit of those discussed.   Repetitive spaced learning was employed today to elicit superior memory formation and behavioral change.  Class 3 severe obesity with serious comorbidity and body mass index (BMI) of 50.0 to 59.9 in adult, unspecified obesity type ([place]).  Melanie Richard is currently in the action stage of change. As such, her goal is to continue with weight loss efforts. She has agreed to the Category 4 Plan.   She will work on meal planning and intentional eating. She will weigh her meat.  We reviewed with the patient labs including Vitamin D, A1c, and insulin.  Exercise goals: All adults should avoid inactivity. Some physical activity is better than none, and adults who participate in any amount of physical activity gain some health benefits.  Behavioral modification strategies: increasing lean protein intake, decreasing simple carbohydrates, increasing vegetables, increasing water intake, decreasing eating out, no skipping meals, meal planning and cooking strategies, keeping healthy foods in the home and planning for success.  Melanie Richard has agreed to follow-up with our clinic in 2-3 weeks. She was informed of the importance of frequent follow-up visits to maximize her success with intensive lifestyle modifications for her multiple health conditions.  Objective:   Blood pressure 126/82, pulse 61, temperature 98.1 F (36.7 C), height 5\' 6"  (1.676 m), weight (!) 360 lb (163.3 kg), SpO2 97 %. Body mass index is 58.11  kg/m.  General: Cooperative, alert, well developed, in no acute distress. HEENT: Conjunctivae and lids unremarkable. Cardiovascular: Regular rhythm.  Lungs: Normal work of breathing. Neurologic: No focal deficits.   Lab Results  Component Value Date   CREATININE 0.81 07/23/2019   BUN 17 07/23/2019   NA 138 07/23/2019   K 4.3 07/23/2019   CL 103 07/23/2019   CO2 28 07/23/2019   Lab Results  Component Value Date   ALT 9 07/23/2019   AST 11 07/23/2019   ALKPHOS 69 07/23/2019   BILITOT 0.5 07/23/2019   Lab Results  Component Value Date   HGBA1C 6.0 11/11/2019   No results found for: INSULIN Lab Results  Component Value Date   TSH 0.67 07/23/2019   Lab Results  Component Value Date   CHOL 174 07/23/2019   HDL 44.20 07/23/2019   LDLCALC 107 (H) 07/23/2019   TRIG 114.0 07/23/2019   CHOLHDL 4 07/23/2019   Lab Results  Component Value Date   WBC 7.1 07/23/2019   HGB 13.2 07/23/2019   HCT 39.9 07/23/2019   MCV 71.6 (L) 07/23/2019   PLT 163.0 07/23/2019   No results found for: IRON, TIBC, FERRITIN  Attestation Statements:   Reviewed by clinician on day of visit: allergies, medications, problem list, medical history, surgical history, family history, social history, and previous encounter notes.  Migdalia Dk, am acting as Location manager for [employer], DO   I have reviewed the above documentation for accuracy and completeness, and I agree with the above. Jearld Lesch, DO

## 2019-12-11 LAB — SARS-COV-2, NAA 2 DAY TAT

## 2019-12-11 LAB — NOVEL CORONAVIRUS, NAA: SARS-CoV-2, NAA: NOT DETECTED

## 2019-12-19 ENCOUNTER — Other Ambulatory Visit (INDEPENDENT_AMBULATORY_CARE_PROVIDER_SITE_OTHER): Payer: Self-pay | Admitting: Bariatrics

## 2019-12-19 DIAGNOSIS — E559 Vitamin D deficiency, unspecified: Secondary | ICD-10-CM

## 2019-12-30 ENCOUNTER — Ambulatory Visit (INDEPENDENT_AMBULATORY_CARE_PROVIDER_SITE_OTHER): Payer: 59 | Admitting: Bariatrics

## 2019-12-30 ENCOUNTER — Other Ambulatory Visit: Payer: Self-pay

## 2019-12-30 ENCOUNTER — Encounter (INDEPENDENT_AMBULATORY_CARE_PROVIDER_SITE_OTHER): Payer: Self-pay | Admitting: Bariatrics

## 2019-12-30 VITALS — BP 107/70 | HR 65 | Temp 97.6°F | Ht 66.0 in | Wt 357.0 lb

## 2019-12-30 DIAGNOSIS — I10 Essential (primary) hypertension: Secondary | ICD-10-CM | POA: Diagnosis not present

## 2019-12-30 DIAGNOSIS — E559 Vitamin D deficiency, unspecified: Secondary | ICD-10-CM | POA: Diagnosis not present

## 2019-12-30 DIAGNOSIS — Z6841 Body Mass Index (BMI) 40.0 and over, adult: Secondary | ICD-10-CM

## 2019-12-30 DIAGNOSIS — Z9189 Other specified personal risk factors, not elsewhere classified: Secondary | ICD-10-CM | POA: Diagnosis not present

## 2019-12-30 DIAGNOSIS — R7303 Prediabetes: Secondary | ICD-10-CM

## 2019-12-30 MED ORDER — METFORMIN HCL 500 MG PO TABS: 500.0000 mg | ORAL_TABLET | Freq: Two times a day (BID) | ORAL | 0 refills | Status: DC

## 2019-12-30 MED ORDER — VITAMIN D (ERGOCALCIFEROL) 1.25 MG (50000 UNIT) PO CAPS: 50000.0000 [IU] | ORAL_CAPSULE | ORAL | 0 refills | Status: DC

## 2019-12-31 ENCOUNTER — Encounter: Payer: Self-pay | Admitting: Nurse Practitioner

## 2019-12-31 ENCOUNTER — Ambulatory Visit (INDEPENDENT_AMBULATORY_CARE_PROVIDER_SITE_OTHER): Payer: 59 | Admitting: Nurse Practitioner

## 2019-12-31 VITALS — BP 126/82 | HR 74 | Temp 97.7°F | Ht 69.0 in | Wt 362.4 lb

## 2019-12-31 DIAGNOSIS — R8290 Unspecified abnormal findings in urine: Secondary | ICD-10-CM

## 2019-12-31 DIAGNOSIS — R109 Unspecified abdominal pain: Secondary | ICD-10-CM

## 2019-12-31 LAB — POCT URINALYSIS DIPSTICK
Bilirubin, UA: NEGATIVE
Blood, UA: NEGATIVE
Glucose, UA: NEGATIVE
Ketones, UA: NEGATIVE
Nitrite, UA: NEGATIVE
Protein, UA: POSITIVE — AB
Spec Grav, UA: 1.025 (ref 1.010–1.025)
Urobilinogen, UA: 1 E.U./dL
pH, UA: 6 (ref 5.0–8.0)

## 2019-12-31 MED ORDER — IBUPROFEN 600 MG PO TABS: 600.0000 mg | ORAL_TABLET | Freq: Three times a day (TID) | ORAL | 0 refills | Status: DC | PRN

## 2019-12-31 MED ORDER — KETOROLAC TROMETHAMINE 30 MG/ML IJ SOLN
30.0000 mg | Freq: Once | INTRAMUSCULAR | Status: AC
  Administered 2019-12-31: 30 mg via INTRAMUSCULAR

## 2019-12-31 MED ORDER — METHOCARBAMOL 500 MG PO TABS: 500.0000 mg | ORAL_TABLET | Freq: Three times a day (TID) | ORAL | 0 refills | Status: DC | PRN

## 2019-12-31 NOTE — Progress Notes (Signed)
Subjective:  Patient ID: Dola Argyle, female    DOB: [birth year]  Age: 59 y.o. MRN: [id number]  CC: Acute Visit (Pt c/o lower left back pain x3 weeks. Pt denies any known injury, pt took otc medication (tylenol, icy hot) with no relief. Pt states she has also tried cold and hot compressions with no relief. )  Back Pain This is a new problem. The current episode started 1 to 4 weeks ago. The problem occurs intermittently. The problem has been waxing and waning since onset. Pain location: left flank. The quality of the pain is described as aching and cramping. The pain does not radiate. The pain is the same all the time. The symptoms are aggravated by sitting. Pertinent negatives include no bladder incontinence, bowel incontinence, dysuria, fever, leg pain, numbness, pelvic pain, perianal numbness, tingling or weakness. Risk factors include obesity, poor posture, sedentary lifestyle and lack of exercise. She has tried NSAIDs, muscle relaxant and heat for the symptoms. The treatment provided no relief.  denies any constipation or dysuria or hematuria or fever  Reviewed past Medical, Social and Family history today.  Outpatient Medications Prior to Visit  Medication Sig Dispense Refill  . aspirin 81 MG EC tablet Take 81 mg by mouth daily.     . hydrochlorothiazide (HYDRODIURIL) 12.5 MG tablet Take 1 tablet (12.5 mg total) by mouth daily. Needs office visit for additional refills 90 tablet 1  . lisinopril (ZESTRIL) 5 MG tablet Take 1 tablet (5 mg total) by mouth daily. 90 tablet 1  . metFORMIN (GLUCOPHAGE) 500 MG tablet Take 1 tablet (500 mg total) by mouth 2 (two) times daily with a meal. With meal 60 tablet 0  . Vitamin D, Ergocalciferol, (DRISDOL) 1.25 MG (50000 UNIT) CAPS capsule Take 1 capsule (50,000 Units total) by mouth every 7 (seven) days. 4 capsule 0   No facility-administered medications prior to visit.    ROS See HPI  Objective:  BP 126/82 (BP Location: Left Arm, Patient  Position: Sitting, Cuff Size: Normal)   Pulse 74   Temp 97.7 F (36.5 C) (Temporal)   Ht 5\' 9"  (1.753 m)   Wt (!) 362 lb 6.4 oz (164.4 kg)   SpO2 97%   BMI 53.52 kg/m   Physical Exam Vitals reviewed.  Constitutional:      Appearance: She is obese.  Pulmonary:     Effort: Pulmonary effort is normal.  Abdominal:     Palpations: Abdomen is soft.     Tenderness: There is no abdominal tenderness. There is left CVA tenderness. There is no right CVA tenderness.  Musculoskeletal:     Lumbar back: Tenderness present. No edema or deformity.       Back:  Skin:    Findings: No erythema or rash.  Neurological:     Mental Status: She is alert and oriented to person, place, and time.     Assessment & Plan:  This visit occurred during the SARS-CoV-2 public health emergency.  Safety protocols were in place, including screening questions prior to the visit, additional usage of staff PPE, and extensive cleaning of exam room while observing appropriate contact time as indicated for disinfecting solutions.   Carlyne was seen today for acute visit.  Diagnoses and all orders for this visit:  Acute left flank pain -     ketorolac (TORADOL) 30 MG/ML injection 30 mg -     Cancel: Urinalysis w microscopic + reflex cultur -     methocarbamol (ROBAXIN) 500 MG tablet; Take  1 tablet (500 mg total) by mouth every 8 (eight) hours as needed for muscle spasms. -     ibuprofen (ADVIL) 600 MG tablet; Take 1 tablet (600 mg total) by mouth every 8 (eight) hours as needed (with food). -     POCT urinalysis dipstick -     CT RENAL STONE STUDY; Future  Abnormal urinalysis -     Urine Culture    Problem List Items Addressed This Visit    None    Visit Diagnoses    Acute left flank pain    -  Primary   Relevant Medications   ketorolac (TORADOL) 30 MG/ML injection 30 mg (Completed)   methocarbamol (ROBAXIN) 500 MG tablet   ibuprofen (ADVIL) 600 MG tablet   Other Relevant Orders   POCT urinalysis  dipstick (Completed)   CT RENAL STONE STUDY   Abnormal urinalysis       Relevant Orders   Urine Culture      Follow-up: No follow-ups on file.  Wilfred Lacy, NP

## 2019-12-31 NOTE — Patient Instructions (Signed)
You will be contacted to schedule appt for Ct urogram  Go to lab for urine collection.  Do not take robaxin and drive due to risk of sedation.

## 2020-01-01 ENCOUNTER — Other Ambulatory Visit: Payer: Self-pay

## 2020-01-01 ENCOUNTER — Ambulatory Visit (INDEPENDENT_AMBULATORY_CARE_PROVIDER_SITE_OTHER)
Admission: RE | Admit: 2020-01-01 | Discharge: 2020-01-01 | Disposition: A | Discharge status: Home / Self Care | Payer: 59 | Source: Ambulatory Visit | Attending: Nurse Practitioner | Admitting: Nurse Practitioner

## 2020-01-01 ENCOUNTER — Other Ambulatory Visit: Payer: Self-pay | Admitting: Nurse Practitioner

## 2020-01-01 DIAGNOSIS — R109 Unspecified abdominal pain: Secondary | ICD-10-CM

## 2020-01-01 DIAGNOSIS — R911 Solitary pulmonary nodule: Secondary | ICD-10-CM

## 2020-01-01 LAB — URINE CULTURE
MICRO NUMBER:: 10948160
SPECIMEN QUALITY:: ADEQUATE

## 2020-01-01 NOTE — Progress Notes (Signed)
Chief Complaint:   OBESITY Melanie Richard is here to discuss her progress with her obesity treatment plan along with follow-up of her obesity related diagnoses. Melanie Richard is on the Category 4 Plan and states she is following her eating plan approximately 5% of the time. Melanie Richard states she is walking for 15 minutes 3 times per week.  Today's visit was #: 3 Starting weight: 356 lbs Starting date: 11/25/2019 Today's weight: 357 lbs Today's date: 12/30/2019 Total lbs lost to date: 0 Total lbs lost since last in-office visit: 3 lbs  Interim History: Melanie Richard is down an additional 3 pounds since her last visit.  She has cut back on portion size.  Subjective:   1. Vitamin D deficiency Melanie Richard's Vitamin D level was 32.0 on 11/11/2019. She is currently taking prescription vitamin D 50,000 IU each week. She denies nausea, vomiting or muscle weakness.  She gets minimal sunlight exposure.  2. Essential hypertension Review: taking medications as instructed, no medication side effects noted, no chest pain on exertion, no dyspnea on exertion, no swelling of ankles.  She is taking HCTZ and Zestril.  Well-controlled.  BP Readings from Last 3 Encounters:  12/31/19 126/82  12/30/19 107/70  12/10/19 126/82   3. Pre-diabetes Melanie Richard has a diagnosis of prediabetes based on her elevated HgA1c and was informed this puts her at greater risk of developing diabetes. She continues to work on diet and exercise to decrease her risk of diabetes. She denies nausea or hypoglycemia.  Denies polyphagia.  Lab Results  Component Value Date   HGBA1C 6.0 11/11/2019   4. At risk for osteoporosis Melanie Richard is at higher risk of osteopenia and osteoporosis due to Vitamin D deficiency.   Assessment/Plan:   1. Vitamin D deficiency Low Vitamin D level contributes to fatigue and are associated with obesity, breast, and colon cancer. She agrees to continue to take prescription Vitamin D @50 ,000 IU every week and will  follow-up for routine testing of Vitamin D, at least 2-3 times per year to avoid over-replacement.  -Refill Vitamin D, Ergocalciferol, (DRISDOL) 1.25 MG (50000 UNIT) CAPS capsule; Take 1 capsule (50,000 Units total) by mouth every 7 (seven) days.  Dispense: 4 capsule; Refill: 0  2. Essential hypertension Melanie Richard is working on healthy weight loss and exercise to improve blood pressure control. We will watch for signs of hypotension as she continues her lifestyle modifications.  Continue medications.  No added salt to meals.  3. Pre-diabetes Melanie Richard will continue to work on weight loss, exercise, and decreasing simple carbohydrates to help decrease the risk of diabetes.  Increase metformin dose, as per below.  -Increase metFORMIN (GLUCOPHAGE) 500 MG tablet; Take 1 tablet (500 mg total) by mouth 2 (two) times daily with a meal. With meal  Dispense: 60 tablet; Refill: 0  4. At risk for osteoporosis Melanie Richard was given approximately 15 minutes of osteoporosis prevention counseling today. Melanie Richard is at risk for osteopenia and osteoporosis due to her Vitamin D deficiency. She was encouraged to take her Vitamin D and follow her higher calcium diet and increase strengthening exercise to help strengthen her bones and decrease her risk of osteopenia and osteoporosis.  Repetitive spaced learning was employed today to elicit superior memory formation and behavioral change.  5. Class 3 severe obesity with serious comorbidity and body mass index (BMI) of 50.0 to 59.9 in adult, unspecified obesity type (Melanie Richard) Melanie Richard is currently in the action stage of change. As such, her goal is to continue with weight loss efforts. She  has agreed to the Category 4 Plan.   She will work on meal planning, intentional eating, and increasing her protein intake.  Exercise goals: For substantial health benefits, adults should do at least 150 minutes (2 hours and 30 minutes) a week of moderate-intensity, or 75 minutes (1  hour and 15 minutes) a week of vigorous-intensity aerobic physical activity, or an equivalent combination of moderate- and vigorous-intensity aerobic activity. Aerobic activity should be performed in episodes of at least 10 minutes, and preferably, it should be spread throughout the week.  Behavioral modification strategies: increasing lean protein intake, decreasing simple carbohydrates, increasing vegetables, increasing water intake, decreasing eating out, no skipping meals, meal planning and cooking strategies, keeping healthy foods in the home and planning for success.  Melanie Richard has agreed to follow-up with our clinic in 2-3 weeks. She was informed of the importance of frequent follow-up visits to maximize her success with intensive lifestyle modifications for her multiple health conditions.   Objective:   Blood pressure 107/70, pulse 65, temperature 97.6 F (36.4 C), height 5\' 6"  (1.676 m), weight (!) 357 lb (161.9 kg), SpO2 98 %. Body mass index is 57.62 kg/m.  General: Cooperative, alert, well developed, in no acute distress. HEENT: Conjunctivae and lids unremarkable. Cardiovascular: Regular rhythm.  Lungs: Normal work of breathing. Neurologic: No focal deficits.   Lab Results  Component Value Date   CREATININE 0.81 07/23/2019   BUN 17 07/23/2019   NA 138 07/23/2019   K 4.3 07/23/2019   CL 103 07/23/2019   CO2 28 07/23/2019   Lab Results  Component Value Date   ALT 9 07/23/2019   AST 11 07/23/2019   ALKPHOS 69 07/23/2019   BILITOT 0.5 07/23/2019   Lab Results  Component Value Date   HGBA1C 6.0 11/11/2019   Lab Results  Component Value Date   TSH 0.67 07/23/2019   Lab Results  Component Value Date   CHOL 174 07/23/2019   HDL 44.20 07/23/2019   LDLCALC 107 (H) 07/23/2019   TRIG 114.0 07/23/2019   CHOLHDL 4 07/23/2019   Lab Results  Component Value Date   WBC 7.1 07/23/2019   HGB 13.2 07/23/2019   HCT 39.9 07/23/2019   MCV 71.6 (L) 07/23/2019   PLT 163.0  07/23/2019   Attestation Statements:   Reviewed by clinician on day of visit: allergies, medications, problem list, medical history, surgical history, family history, social history, and previous encounter notes.  I, Water quality scientist, CMA, am acting as Location manager for [employer], DO  I have reviewed the above documentation for accuracy and completeness, and I agree with the above. Jearld Lesch, DO

## 2020-01-02 ENCOUNTER — Other Ambulatory Visit (INDEPENDENT_AMBULATORY_CARE_PROVIDER_SITE_OTHER): Payer: Self-pay | Admitting: Bariatrics

## 2020-01-02 ENCOUNTER — Encounter (INDEPENDENT_AMBULATORY_CARE_PROVIDER_SITE_OTHER): Payer: Self-pay | Admitting: Bariatrics

## 2020-01-02 DIAGNOSIS — R7303 Prediabetes: Secondary | ICD-10-CM

## 2020-01-15 ENCOUNTER — Other Ambulatory Visit (INDEPENDENT_AMBULATORY_CARE_PROVIDER_SITE_OTHER): Payer: Self-pay | Admitting: Bariatrics

## 2020-01-15 ENCOUNTER — Other Ambulatory Visit: Payer: Self-pay | Admitting: Nurse Practitioner

## 2020-01-15 ENCOUNTER — Other Ambulatory Visit: Payer: Self-pay

## 2020-01-15 ENCOUNTER — Ambulatory Visit
Admission: RE | Admit: 2020-01-15 | Discharge: 2020-01-15 | Disposition: A | Discharge status: Home / Self Care | Payer: 59 | Source: Ambulatory Visit | Attending: Nurse Practitioner | Admitting: Nurse Practitioner

## 2020-01-15 DIAGNOSIS — E559 Vitamin D deficiency, unspecified: Secondary | ICD-10-CM

## 2020-01-15 DIAGNOSIS — R911 Solitary pulmonary nodule: Secondary | ICD-10-CM

## 2020-01-15 DIAGNOSIS — I10 Essential (primary) hypertension: Secondary | ICD-10-CM

## 2020-01-15 DIAGNOSIS — R109 Unspecified abdominal pain: Secondary | ICD-10-CM

## 2020-01-15 MED ORDER — IOPAMIDOL (ISOVUE-300) INJECTION 61%
100.0000 mL | Freq: Once | INTRAVENOUS | Status: AC | PRN
  Administered 2020-01-15: 100 mL via INTRAVENOUS

## 2020-01-16 ENCOUNTER — Telehealth: Payer: Self-pay | Admitting: Nurse Practitioner

## 2020-01-16 NOTE — Telephone Encounter (Signed)
Pt notified that once results are reviewed by the provider she will be contacted. Pt verbalized understanding.

## 2020-01-16 NOTE — Telephone Encounter (Signed)
Patient is calling and wanting to see if her CT results were back yet, please advise. CB is [phone]

## 2020-01-17 NOTE — Telephone Encounter (Signed)
FYI: Patient wants to add to her message that she also has a persistent cough.

## 2020-01-22 ENCOUNTER — Telehealth: Payer: Self-pay | Admitting: Nurse Practitioner

## 2020-01-22 DIAGNOSIS — R918 Other nonspecific abnormal finding of lung field: Secondary | ICD-10-CM

## 2020-01-22 DIAGNOSIS — J90 Pleural effusion, not elsewhere classified: Secondary | ICD-10-CM

## 2020-01-22 MED ORDER — ALBUTEROL SULFATE HFA 108 (90 BASE) MCG/ACT IN AERS: 1.0000 | INHALATION_SPRAY | Freq: Four times a day (QID) | RESPIRATORY_TRACT | 0 refills | Status: AC | PRN

## 2020-01-22 MED ORDER — LEVOFLOXACIN 500 MG PO TABS: 500.0000 mg | ORAL_TABLET | Freq: Every day | ORAL | 0 refills | Status: DC

## 2020-01-22 NOTE — Telephone Encounter (Signed)
I sent levaquin and albuterol. Also entered referral to pulmonology.  What was the reason for pulmonary appts in the past?

## 2020-01-22 NOTE — Telephone Encounter (Signed)
-----   Message from Melanie Richard, [place] sent at 01/21/2020  9:08 AM EDT ----- Patient notified and verbalized understanding Pt states she has been coughing and experiencing some SOB. She states she use to see a pulmonologist back home and they use to have her on an inhaler. Pt would like to know if she should be referred to one here and if so could you please make the referral for her.

## 2020-01-22 NOTE — Telephone Encounter (Signed)
Patient is calling and wanted to speak to someone regarding getting a referral to see a pulmonologist, please advise. CB is [phone]

## 2020-01-23 NOTE — Telephone Encounter (Signed)
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

## 2020-01-27 ENCOUNTER — Ambulatory Visit (INDEPENDENT_AMBULATORY_CARE_PROVIDER_SITE_OTHER): Payer: 59 | Admitting: Bariatrics

## 2020-01-27 ENCOUNTER — Encounter (INDEPENDENT_AMBULATORY_CARE_PROVIDER_SITE_OTHER): Payer: Self-pay | Admitting: Bariatrics

## 2020-01-27 ENCOUNTER — Other Ambulatory Visit: Payer: Self-pay

## 2020-01-27 VITALS — BP 105/68 | HR 70 | Temp 98.1°F | Ht 69.0 in | Wt 357.0 lb

## 2020-01-27 DIAGNOSIS — I10 Essential (primary) hypertension: Secondary | ICD-10-CM

## 2020-01-27 DIAGNOSIS — Z9989 Dependence on other enabling machines and devices: Secondary | ICD-10-CM

## 2020-01-27 DIAGNOSIS — G4733 Obstructive sleep apnea (adult) (pediatric): Secondary | ICD-10-CM | POA: Diagnosis not present

## 2020-01-27 DIAGNOSIS — Z6841 Body Mass Index (BMI) 40.0 and over, adult: Secondary | ICD-10-CM

## 2020-01-27 NOTE — Progress Notes (Signed)
Chief Complaint:   OBESITY Melanie Richard is here to discuss her progress with her obesity treatment plan along with follow-up of her obesity related diagnoses. Melanie Richard is on the Category 4 Plan and states she is following her eating plan approximately 2% of the time. Melanie Richard states she is walking 15 minutes 3 times per week.  Today's visit was #: 4 Starting weight: 356 lbs Starting date: 11/25/2019 Today's weight: 357 lbs Today's date: 01/27/2020 Total lbs lost to date: 0 Total lbs lost since last in-office visit: 0  Interim History: Melanie Richard's weight remains the same.  Subjective:   Essential hypertension. Melanie Richard is taking HCTZ and Zestril. Blood pressure is controlled.  BP Readings from Last 3 Encounters:  01/27/20 105/68  12/31/19 126/82  12/30/19 107/70   Lab Results  Component Value Date   CREATININE 0.81 07/23/2019   CREATININE 0.89 03/15/2019   OSA on CPAP. Melanie Richard is wearing CPAP for OSA. She states she is sleeping 4-8 hours of sleep, but reports she has not been getting adequate sleep in the past.  Assessment/Plan:   Essential hypertension. Melanie Richard is working on healthy weight loss and exercise to improve blood pressure control. We will watch for signs of hypotension as she continues her lifestyle modifications. She will continue her medications as directed.   OSA on CPAP. Intensive lifestyle modifications are the first line treatment for this issue. We discussed several lifestyle modifications today and she will continue to work on diet, exercise and weight loss efforts. We will continue to monitor. Orders and follow up as documented in patient record. Melanie Richard will continue CPAP use nightly.  Class 3 severe obesity with serious comorbidity and body mass index (BMI) of 50.0 to 59.9 in adult, unspecified obesity type ([place]).  Melanie Richard is currently in the action stage of change. As such, her goal is to continue with weight loss efforts. She has  agreed to the Category 4 Plan.   She will work on meal planning, intentional eating, being adherent to the meal plan, and will pack her lunch and some snacks.  Exercise goals: Melanie Richard will continue to walk 15 minutes 3 times per week.  Behavioral modification strategies: increasing lean protein intake, decreasing simple carbohydrates, increasing vegetables, increasing water intake, decreasing eating out, no skipping meals, meal planning and cooking strategies, keeping healthy foods in the home and planning for success.  Melanie Richard has agreed to follow-up with our clinic in 3 weeks. She was informed of the importance of frequent follow-up visits to maximize her success with intensive lifestyle modifications for her multiple health conditions.   Objective:   Blood pressure 105/68, pulse 70, temperature 98.1 F (36.7 C), height 5\' 9"  (1.753 m), weight (!) 357 lb (161.9 kg), SpO2 93 %. Body mass index is 52.72 kg/m.  General: Cooperative, alert, well developed, in no acute distress. HEENT: Conjunctivae and lids unremarkable. Cardiovascular: Regular rhythm.  Lungs: Normal work of breathing. Neurologic: No focal deficits.   Lab Results  Component Value Date   CREATININE 0.81 07/23/2019   BUN 17 07/23/2019   NA 138 07/23/2019   K 4.3 07/23/2019   CL 103 07/23/2019   CO2 28 07/23/2019   Lab Results  Component Value Date   ALT 9 07/23/2019   AST 11 07/23/2019   ALKPHOS 69 07/23/2019   BILITOT 0.5 07/23/2019   Lab Results  Component Value Date   HGBA1C 6.0 11/11/2019   No results found for: INSULIN Lab Results  Component Value Date   TSH  0.67 07/23/2019   Lab Results  Component Value Date   CHOL 174 07/23/2019   HDL 44.20 07/23/2019   LDLCALC 107 (H) 07/23/2019   TRIG 114.0 07/23/2019   CHOLHDL 4 07/23/2019   Lab Results  Component Value Date   WBC 7.1 07/23/2019   HGB 13.2 07/23/2019   HCT 39.9 07/23/2019   MCV 71.6 (L) 07/23/2019   PLT 163.0 07/23/2019   No  results found for: IRON, TIBC, FERRITIN  Attestation Statements:   Reviewed by clinician on day of visit: allergies, medications, problem list, medical history, surgical history, family history, social history, and previous encounter notes.  Time spent on visit including pre-visit chart review and post-visit charting and care was 20 minutes.   Migdalia Dk, am acting as Location manager for [employer], DO   I have reviewed the above documentation for accuracy and completeness, and I agree with the above. Jearld Lesch, DO

## 2020-01-31 ENCOUNTER — Telehealth: Payer: Self-pay | Admitting: Nurse Practitioner

## 2020-01-31 NOTE — Telephone Encounter (Signed)
Patient is calling to let the provider know that she still has a persistent cough. She wants to know what she should do. Please give her a call back at [phone] and advise.

## 2020-02-04 NOTE — Telephone Encounter (Signed)
Did she complete levaquin rx? Is she using albuterol inhaler? If yes, how often and does it provide any relief? I entered referral to pulmonologist, so she should a call from them.

## 2020-02-05 NOTE — Telephone Encounter (Signed)
Pt states she did finish Levaquin Rx and she is using her inhaler twice a day and it helps a little. Pt states her appt with the pulmonologist is 02/22/2020.

## 2020-02-05 NOTE — Telephone Encounter (Signed)
Maintain upcoming appt and continue albuterol use

## 2020-02-06 ENCOUNTER — Other Ambulatory Visit (INDEPENDENT_AMBULATORY_CARE_PROVIDER_SITE_OTHER): Payer: Self-pay | Admitting: Bariatrics

## 2020-02-06 DIAGNOSIS — R7303 Prediabetes: Secondary | ICD-10-CM

## 2020-02-06 NOTE — Telephone Encounter (Signed)
Patient notified and verbalized understanding. 

## 2020-02-15 ENCOUNTER — Other Ambulatory Visit (INDEPENDENT_AMBULATORY_CARE_PROVIDER_SITE_OTHER): Payer: Self-pay | Admitting: Bariatrics

## 2020-02-15 DIAGNOSIS — E559 Vitamin D deficiency, unspecified: Secondary | ICD-10-CM

## 2020-02-17 ENCOUNTER — Ambulatory Visit (INDEPENDENT_AMBULATORY_CARE_PROVIDER_SITE_OTHER): Payer: 59 | Admitting: Bariatrics

## 2020-02-17 NOTE — Telephone Encounter (Signed)
Dr Owens Shark pt

## 2020-02-20 ENCOUNTER — Other Ambulatory Visit (INDEPENDENT_AMBULATORY_CARE_PROVIDER_SITE_OTHER): Payer: Self-pay | Admitting: Bariatrics

## 2020-02-20 ENCOUNTER — Telehealth: Payer: Self-pay

## 2020-02-20 ENCOUNTER — Telehealth: Payer: Self-pay | Admitting: Nurse Practitioner

## 2020-02-20 DIAGNOSIS — I10 Essential (primary) hypertension: Secondary | ICD-10-CM

## 2020-02-20 DIAGNOSIS — R7303 Prediabetes: Secondary | ICD-10-CM

## 2020-02-20 NOTE — Telephone Encounter (Signed)
Patient is calling and wanted to speak to someone regarding medication, please advise. CB is [phone]

## 2020-02-20 NOTE — Telephone Encounter (Signed)
error 

## 2020-02-20 NOTE — Telephone Encounter (Signed)
Pt is returning call.  CB [phone]

## 2020-02-21 ENCOUNTER — Other Ambulatory Visit: Payer: Self-pay

## 2020-02-21 ENCOUNTER — Ambulatory Visit (INDEPENDENT_AMBULATORY_CARE_PROVIDER_SITE_OTHER): Payer: 59 | Admitting: Pulmonary Disease

## 2020-02-21 ENCOUNTER — Encounter: Payer: Self-pay | Admitting: Pulmonary Disease

## 2020-02-21 VITALS — BP 122/82 | HR 69 | Temp 97.9°F | Ht 69.5 in | Wt 360.2 lb

## 2020-02-21 DIAGNOSIS — T464X5A Adverse effect of angiotensin-converting-enzyme inhibitors, initial encounter: Secondary | ICD-10-CM

## 2020-02-21 DIAGNOSIS — Z6841 Body Mass Index (BMI) 40.0 and over, adult: Secondary | ICD-10-CM

## 2020-02-21 DIAGNOSIS — R059 Cough, unspecified: Secondary | ICD-10-CM | POA: Diagnosis not present

## 2020-02-21 DIAGNOSIS — J90 Pleural effusion, not elsewhere classified: Secondary | ICD-10-CM

## 2020-02-21 DIAGNOSIS — R058 Other specified cough: Secondary | ICD-10-CM

## 2020-02-21 DIAGNOSIS — G4733 Obstructive sleep apnea (adult) (pediatric): Secondary | ICD-10-CM

## 2020-02-21 DIAGNOSIS — I10 Essential (primary) hypertension: Secondary | ICD-10-CM

## 2020-02-21 DIAGNOSIS — Z9989 Dependence on other enabling machines and devices: Secondary | ICD-10-CM

## 2020-02-21 DIAGNOSIS — I5032 Chronic diastolic (congestive) heart failure: Secondary | ICD-10-CM

## 2020-02-21 MED ORDER — ALBUTEROL SULFATE (2.5 MG/3ML) 0.083% IN NEBU: 2.5000 mg | INHALATION_SOLUTION | Freq: Four times a day (QID) | RESPIRATORY_TRACT | 12 refills | Status: AC | PRN

## 2020-02-21 MED ORDER — AMLODIPINE BESYLATE 5 MG PO TABS: 5.0000 mg | ORAL_TABLET | Freq: Every day | ORAL | 11 refills | Status: DC

## 2020-02-21 NOTE — Patient Instructions (Addendum)
Thank you for visiting Dr. Valeta Harms at [facility]. Today we recommend the following:  Orders Placed This Encounter  Procedures  . ECHOCARDIOGRAM COMPLETE   Meds ordered this encounter  Medications  . albuterol (PROVENTIL) (2.5 MG/3ML) 0.083% nebulizer solution    Sig: Take 3 mLs (2.5 mg total) by nebulization every 6 (six) hours as needed for wheezing or shortness of breath.    Dispense:  75 mL    Refill:  12   Follow up with PCP or Cardiology to consider trial off Lisinopril to see if this improves chronic cough.   Return in about 4 weeks (around 03/20/2020) for Dr. Ander Slade.    Please do your part to reduce the spread of COVID-19.

## 2020-02-21 NOTE — Progress Notes (Signed)
Synopsis: Referred in November 2021 for pleural effusion by Nche, Charlene Brooke, NP  Subjective:   PATIENT ID: Melanie Richard GENDER: female DOB: 1960-08-04, MRN: [id number]  Chief Complaint  Patient presents with  . Consult    pleural effusion, cough and SHOB    This is a 59 year old female, previously established patient of Dr. Ander Slade, seen in our clinic for obstructive sleep apnea. Last seen in the office July 2021. Patient was found to have moderate obstructive sleep apnea and patient was placed on CPAP. Past medical history includes a pacemaker, hypertension, morbid obesity. She recently was seen in the emergency department at [facility] with complaints of cough and clear sputum x2 weeks. She is also treated with Levaquin. When talking to her about her cough she has had cough for a very long time. Chronically the cough is dry nonproductive however recently with this new acute flare of her cough developed some sputum production. She had a CT scan of the chest which revealed a small right-sided pleural effusion and a small pulmonary nodule. She is a lifelong non-smoker. No family history of lung cancer.    Past Medical History:  Diagnosis Date  . Back pain   . H/O blood clots   . Hypertension   . Pacemaker   . Sleep apnea    cpap nightly     Family History  Problem Relation Age of Onset  . Hypertension Mother   . Heart disease Mother   . Hypertension Father   . Diabetes Father   . Diabetes Brother   . Intellectual disability Brother   . Cancer Maternal Aunt 40       Breast cancer  . Cancer Maternal Uncle        kidney  . Colon cancer Neg Hx   . Esophageal cancer Neg Hx   . Stomach cancer Neg Hx   . Rectal cancer Neg Hx      Past Surgical History:  Procedure Laterality Date  . COLONOSCOPY WITH PROPOFOL N/A 09/30/2019   Procedure: COLONOSCOPY WITH PROPOFOL;  Surgeon: Jerene Bears, MD;  Location: [facility];  Service: Gastroenterology;  Laterality: N/A;  .  lymphnode removal    . PACEMAKER IMPLANT    . PACEMAKER PLACEMENT  06/29/2018  . POLYPECTOMY  09/30/2019   Procedure: POLYPECTOMY;  Surgeon: Jerene Bears, MD;  Location: [facility];  Service: Gastroenterology;;  . SKIN GRAFT      Social History   Socioeconomic History  . Marital status: Divorced    Spouse name: Not on file  . Number of children: 2  . Years of education: Not on file  . Highest education level: Not on file  Occupational History  . Occupation: CNA  Tobacco Use  . Smoking status: Never Smoker  . Smokeless tobacco: Never Used  Vaping Use  . Vaping Use: Never used  Substance and Sexual Activity  . Alcohol use: Never  . Drug use: Never  . Sexual activity: Yes    Birth control/protection: Post-menopausal  Other Topics Concern  . Not on file  Social History Narrative  . Not on file   Social Determinants of Health   Financial Resource Strain:   . Difficulty of Paying Living Expenses: Not on file  Food Insecurity:   . Worried About Charity fundraiser in the Last Year: Not on file  . Ran Out of Food in the Last Year: Not on file  Transportation Needs:   . Lack of Transportation (Medical): Not on  file  . Lack of Transportation (Non-Medical): Not on file  Physical Activity:   . Days of Exercise per Week: Not on file  . Minutes of Exercise per Session: Not on file  Stress:   . Feeling of Stress : Not on file  Social Connections:   . Frequency of Communication with Friends and Family: Not on file  . Frequency of Social Gatherings with Friends and Family: Not on file  . Attends Religious Services: Not on file  . Active Member of Clubs or Organizations: Not on file  . Attends Archivist Meetings: Not on file  . Marital Status: Not on file  Intimate Partner Violence:   . Fear of Current or Ex-Partner: Not on file  . Emotionally Abused: Not on file  . Physically Abused: Not on file  . Sexually Abused: Not on file     No Known Allergies    Outpatient Medications Prior to Visit  Medication Sig Dispense Refill  . albuterol (VENTOLIN HFA) 108 (90 Base) MCG/ACT inhaler Inhale 1-2 puffs into the lungs every 6 (six) hours as needed for wheezing or shortness of breath. 8 g 0  . aspirin 81 MG EC tablet Take 81 mg by mouth daily.     Marland Kitchen doxycycline (MONODOX) 100 MG capsule Take by mouth.    . hydrochlorothiazide (HYDRODIURIL) 12.5 MG tablet Take 1 tablet (12.5 mg total) by mouth daily. Needs office visit for additional refills 90 tablet 1  . ibuprofen (ADVIL) 600 MG tablet Take 1 tablet (600 mg total) by mouth every 8 (eight) hours as needed (with food). 30 tablet 0  . lisinopril (ZESTRIL) 5 MG tablet TAKE 1 TABLET BY MOUTH EVERY DAY 90 tablet 1  . metFORMIN (GLUCOPHAGE) 500 MG tablet TAKE 1 TABLET (500 MG TOTAL) BY MOUTH 2 (TWO) TIMES DAILY WITH A MEAL. WITH MEAL 60 tablet 0  . methocarbamol (ROBAXIN) 500 MG tablet Take 1 tablet (500 mg total) by mouth every 8 (eight) hours as needed for muscle spasms. 21 tablet 0  . Vitamin D, Ergocalciferol, (DRISDOL) 1.25 MG (50000 UNIT) CAPS capsule Take 1 capsule (50,000 Units total) by mouth every 7 (seven) days. 4 capsule 0  . levofloxacin (LEVAQUIN) 500 MG tablet Take 1 tablet (500 mg total) by mouth daily. (Patient not taking: Reported on 02/21/2020) 7 tablet 0   No facility-administered medications prior to visit.    Review of Systems  Constitutional: Negative for chills, fever, malaise/fatigue and weight loss.  HENT: Negative for hearing loss, sore throat and tinnitus.   Eyes: Negative for blurred vision and double vision.  Respiratory: Negative for cough, hemoptysis, sputum production, shortness of breath, wheezing and stridor.   Cardiovascular: Negative for chest pain, palpitations, orthopnea, leg swelling and PND.  Gastrointestinal: Negative for abdominal pain, constipation, diarrhea, heartburn, nausea and vomiting.  Genitourinary: Negative for dysuria, hematuria and urgency.   Musculoskeletal: Negative for joint pain and myalgias.  Skin: Negative for itching and rash.  Neurological: Negative for dizziness, tingling, weakness and headaches.  Endo/Heme/Allergies: Negative for environmental allergies. Does not bruise/bleed easily.  Psychiatric/Behavioral: Negative for depression. The patient is not nervous/anxious and does not have insomnia.   All other systems reviewed and are negative.    Objective:  Physical Exam Vitals reviewed.  Constitutional:      General: She is not in acute distress.    Appearance: She is well-developed. She is obese.  HENT:     Head: Normocephalic and atraumatic.  Eyes:     General:  No scleral icterus.    Conjunctiva/sclera: Conjunctivae normal.     Pupils: Pupils are equal, round, and reactive to light.  Neck:     Vascular: No JVD.     Trachea: No tracheal deviation.  Cardiovascular:     Rate and Rhythm: Normal rate and regular rhythm.     Heart sounds: Normal heart sounds. No murmur heard.      Comments: Difficult to auscultate due to size Pulmonary:     Effort: Pulmonary effort is normal. No tachypnea, accessory muscle usage or respiratory distress.     Breath sounds: No stridor. No wheezing, rhonchi or rales.     Comments: Difficult to auscultate due to size Abdominal:     General: Bowel sounds are normal. There is no distension.     Palpations: Abdomen is soft.     Tenderness: There is no abdominal tenderness.  Musculoskeletal:        General: No tenderness.     Cervical back: Neck supple.  Lymphadenopathy:     Cervical: No cervical adenopathy.  Skin:    General: Skin is warm and dry.     Capillary Refill: Capillary refill takes less than 2 seconds.     Findings: No rash.  Neurological:     Mental Status: She is alert and oriented to person, place, and time.  Psychiatric:        Behavior: Behavior normal.      Vitals:   02/21/20 1002  BP: 122/82  Pulse: 69  Temp: 97.9 F (36.6 C)  TempSrc: Tympanic   SpO2: 96%  Weight: (!) 360 lb 4 oz (163.4 kg)  Height: 5' 9.5" (1.765 m)   96% on RA  BMI Readings from Last 3 Encounters:  02/21/20 52.44 kg/m  01/27/20 52.72 kg/m  12/31/19 53.52 kg/m   Wt Readings from Last 3 Encounters:  02/21/20 (!) 360 lb 4 oz (163.4 kg)  01/27/20 (!) 357 lb (161.9 kg)  12/31/19 (!) 362 lb 6.4 oz (164.4 kg)     CBC    Component Value Date/Time   WBC 7.1 07/23/2019 0856   RBC 5.58 (H) 07/23/2019 0856   HGB 13.2 07/23/2019 0856   HCT 39.9 07/23/2019 0856   PLT 163.0 07/23/2019 0856   MCV 71.6 (L) 07/23/2019 0856   MCH 23.5 (L) 03/15/2019 0126   MCHC 33.2 07/23/2019 0856   RDW 16.5 (H) 07/23/2019 0856   LYMPHSABS 2.1 03/15/2019 0126   MONOABS 0.6 03/15/2019 0126   EOSABS 0.2 03/15/2019 0126   BASOSABS 0.0 03/15/2019 0126     Chest Imaging: 01/16/2020: CT chest Small right-sided pleural effusion, small subpleural nodule. Mildly enlarged mediastinal nodes. Presumed reactive. The patient's images have been independently reviewed by me.    Pulmonary Functions Testing Results: No flowsheet data found.  FeNO:   Pathology:   Echocardiogram:  Prior echocardiogram result from March 2020 completed at [facility] result and epic care everywhere. Preserved EF with a grade 1 diastolic dysfunction.  Heart Catheterization:     Assessment & Plan:     ICD-10-CM   1. Cough  R05.9   2. Essential hypertension  I10 ECHOCARDIOGRAM COMPLETE  3. OSA on CPAP  G47.33    Z99.89   4. Morbid obesity ([place])  E66.01   5. Pleural effusion  J90   6. Chronic diastolic heart failure (HCC)  I50.32   7. ACE-inhibitor cough  R05.8    T46.4X5A   8. BMI 50.0-59.9, adult [facility])  [phone]  Discussion:  This is a 59 year old with a rather extensive past medical history. She is compliant with her OSA treatment on CPAP. She does take an ACE inhibitor which may contribute to her cough. She may very well have some symptoms related to obesity related asthma. This  is unclear at this time. She has a small pleural effusion that looks simple on CT suspect related to volume and underlying chronic diastolic heart failure.  Plan: Patient will need follow-up in clinic. Possibly at next office visit consider chest x-ray to ensure effusion is not enlarging. She also need a 3 to 53-month follow-up noncontrasted CT for evaluation of mediastinal nodes. I will let her primary pulmonologist Dr. Jenetta Downer take care of this on his next office visit. I will ensure follow-up arranged with him.  I recommended her to get an echocardiogram. This will help evaluate to see if she has had any change or reduction in her ejection fraction which may contribute to her development of pleural effusion. I recommended her to follow-up with cardiology or her primary care doctor to consider trialing off of the ACE inhibitor and replacement of an additional blood pressure medication to see if her chronic cough gets any better.  Refills given for her albuterol nebulizer.   Current Outpatient Medications:  .  albuterol (VENTOLIN HFA) 108 (90 Base) MCG/ACT inhaler, Inhale 1-2 puffs into the lungs every 6 (six) hours as needed for wheezing or shortness of breath., Disp: 8 g, Rfl: 0 .  aspirin 81 MG EC tablet, Take 81 mg by mouth daily. , Disp: , Rfl:  .  doxycycline (MONODOX) 100 MG capsule, Take by mouth., Disp: , Rfl:  .  hydrochlorothiazide (HYDRODIURIL) 12.5 MG tablet, Take 1 tablet (12.5 mg total) by mouth daily. Needs office visit for additional refills, Disp: 90 tablet, Rfl: 1 .  ibuprofen (ADVIL) 600 MG tablet, Take 1 tablet (600 mg total) by mouth every 8 (eight) hours as needed (with food)., Disp: 30 tablet, Rfl: 0 .  lisinopril (ZESTRIL) 5 MG tablet, TAKE 1 TABLET BY MOUTH EVERY DAY, Disp: 90 tablet, Rfl: 1 .  metFORMIN (GLUCOPHAGE) 500 MG tablet, TAKE 1 TABLET (500 MG TOTAL) BY MOUTH 2 (TWO) TIMES DAILY WITH A MEAL. WITH MEAL, Disp: 60 tablet, Rfl: 0 .  methocarbamol (ROBAXIN) 500 MG  tablet, Take 1 tablet (500 mg total) by mouth every 8 (eight) hours as needed for muscle spasms., Disp: 21 tablet, Rfl: 0 .  Vitamin D, Ergocalciferol, (DRISDOL) 1.25 MG (50000 UNIT) CAPS capsule, Take 1 capsule (50,000 Units total) by mouth every 7 (seven) days., Disp: 4 capsule, Rfl: 0  I spent 32 minutes dedicated to the care of this patient on the date of this encounter to include pre-visit review of records, face-to-face time with the patient discussing conditions above, post visit ordering of testing, clinical documentation with the electronic health record, making appropriate referrals as documented, and communicating necessary findings to members of the patients care team.    Garner Nash, DO [facility] 02/21/2020 10:09 AM

## 2020-02-21 NOTE — Telephone Encounter (Signed)
Pt states she went to ER wednesday and pulmonary doctor today and both advised her to come off of lisinopril and she wanted to inform you of this and wanted to know if there is another medication she could take instead. Please advise.

## 2020-02-21 NOTE — Telephone Encounter (Signed)
Stop lisinopril Amlodipine sent in place Schedule 9month f/up with me

## 2020-02-24 NOTE — Telephone Encounter (Signed)
This patient was last seen by Dr. Owens Shark, and currently has an upcoming appt scheduled on 02/26/20 with her.

## 2020-02-24 NOTE — Telephone Encounter (Signed)
LVM for patient to return call. 

## 2020-02-26 ENCOUNTER — Encounter (INDEPENDENT_AMBULATORY_CARE_PROVIDER_SITE_OTHER): Payer: Self-pay | Admitting: Bariatrics

## 2020-02-26 ENCOUNTER — Other Ambulatory Visit: Payer: Self-pay

## 2020-02-26 ENCOUNTER — Ambulatory Visit (INDEPENDENT_AMBULATORY_CARE_PROVIDER_SITE_OTHER): Payer: 59 | Admitting: Bariatrics

## 2020-02-26 VITALS — BP 115/65 | HR 57 | Temp 98.6°F | Ht 69.0 in | Wt 354.0 lb

## 2020-02-26 DIAGNOSIS — Z9189 Other specified personal risk factors, not elsewhere classified: Secondary | ICD-10-CM | POA: Diagnosis not present

## 2020-02-26 DIAGNOSIS — R7303 Prediabetes: Secondary | ICD-10-CM | POA: Diagnosis not present

## 2020-02-26 DIAGNOSIS — Z6841 Body Mass Index (BMI) 40.0 and over, adult: Secondary | ICD-10-CM

## 2020-02-26 DIAGNOSIS — E559 Vitamin D deficiency, unspecified: Secondary | ICD-10-CM

## 2020-02-26 MED ORDER — METFORMIN HCL 500 MG PO TABS: 500.0000 mg | ORAL_TABLET | Freq: Two times a day (BID) | ORAL | 0 refills | Status: DC

## 2020-02-26 MED ORDER — VITAMIN D (ERGOCALCIFEROL) 1.25 MG (50000 UNIT) PO CAPS: 50000.0000 [IU] | ORAL_CAPSULE | ORAL | 0 refills | Status: AC

## 2020-02-27 ENCOUNTER — Ambulatory Visit (HOSPITAL_COMMUNITY)
Admission: RE | Admit: 2020-02-27 | Discharge: 2020-02-27 | Disposition: A | Discharge status: Home / Self Care | Payer: 59 | Source: Ambulatory Visit | Attending: Pulmonary Disease | Admitting: Pulmonary Disease

## 2020-02-27 ENCOUNTER — Encounter (INDEPENDENT_AMBULATORY_CARE_PROVIDER_SITE_OTHER): Payer: Self-pay | Admitting: Bariatrics

## 2020-02-27 DIAGNOSIS — I10 Essential (primary) hypertension: Secondary | ICD-10-CM

## 2020-02-27 DIAGNOSIS — I5022 Chronic systolic (congestive) heart failure: Secondary | ICD-10-CM | POA: Insufficient documentation

## 2020-02-27 DIAGNOSIS — Z950 Presence of cardiac pacemaker: Secondary | ICD-10-CM | POA: Diagnosis not present

## 2020-02-27 DIAGNOSIS — I110 Hypertensive heart disease with heart failure: Secondary | ICD-10-CM | POA: Insufficient documentation

## 2020-02-27 DIAGNOSIS — G4730 Sleep apnea, unspecified: Secondary | ICD-10-CM | POA: Diagnosis not present

## 2020-02-27 LAB — ECHOCARDIOGRAM COMPLETE
Area-P 1/2: 2.39 cm2
S' Lateral: 3.9 cm

## 2020-02-27 NOTE — Progress Notes (Signed)
Chief Complaint:   OBESITY Melanie Richard is here to discuss her progress with her obesity treatment plan along with follow-up of her obesity related diagnoses. Melanie Richard is on the Category 4 Plan and states she is following her eating plan approximately 0% of the time. Melanie Richard states she is walking 10 minutes 3 times per week.  Today's visit was #: 5 Starting weight: 356 lbs Starting date: 11/25/2019 Today's weight: 354 lbs Today's date: 02/26/2020 Total lbs lost to date: 2 Total lbs lost since last in-office visit: 3  Interim History: Melanie Richard is down 3 lbs from her last visit. She reports drinking more water.  Subjective:   Pre-diabetes. [place] has a diagnosis of prediabetes based on her elevated HgA1c and was informed this puts her at greater risk of developing diabetes. She continues to work on diet and exercise to decrease her risk of diabetes. She denies nausea or hypoglycemia. Melanie Richard reports decreased appetite.  Lab Results  Component Value Date   HGBA1C 6.0 11/11/2019   No results found for: INSULIN  Vitamin D deficiency. No nausea, vomiting, or muscle weakness.   At risk for hypoglycemia. Melanie Richard is at increased risk for hypoglycemia due to prediabetes.  Assessment/Plan:   Pre-diabetes. Melanie Richard will continue to work on weight loss, exercise, and decreasing simple carbohydrates to help decrease the risk of diabetes. Prescription was given for metFORMIN (GLUCOPHAGE) 500 MG tablet 1 tablet BID with meals #60 with 0 refills.  Vitamin D deficiency. Low Vitamin D level contributes to fatigue and are associated with obesity, breast, and colon cancer. She was given a prescription for Vitamin D, Ergocalciferol, (DRISDOL) 1.25 MG (50000 UNIT) CAPS capsule every week #4 with 0 refills and will follow-up for routine testing of Vitamin D, at least 2-3 times per year to avoid over-replacement.   At risk for hypoglycemia. Melanie Richard was given approximately 15  minutes of counseling today regarding prevention of hyperglycemia. She was advised of hyperglycemia causes and the fact hyperglycemia is often asymptomatic. Melanie Richard was instructed to avoid skipping meals, eat regular protein rich meals and schedule low calorie but protein rich snacks as needed.   Repetitive spaced learning was employed today to elicit superior memory formation and behavioral change.   Class 3 severe obesity with serious comorbidity and body mass index (BMI) of 50.0 to 59.9 in adult, unspecified obesity type ([place]).  Melanie Richard is currently in the action stage of change. As such, her goal is to continue with weight loss efforts. She has agreed to the Category 4 Plan.   She will work on meal planning, intentional eating, and adhering more closely to the plan.  Exercise goals: All adults should avoid inactivity. Some physical activity is better than none, and adults who participate in any amount of physical activity gain some health benefits.  Behavioral modification strategies: increasing lean protein intake, decreasing simple carbohydrates, increasing vegetables, increasing water intake, decreasing eating out, no skipping meals, meal planning and cooking strategies, keeping healthy foods in the home, holiday eating strategies , celebration eating strategies, avoiding temptations and planning for success.  Melanie Richard has agreed to follow-up with our clinic fasting in 2-3 weeks. She was informed of the importance of frequent follow-up visits to maximize her success with intensive lifestyle modifications for her multiple health conditions.   Objective:   Blood pressure 115/65, pulse (!) 57, temperature 98.6 F (37 C), temperature source Oral, height 5\' 9"  (1.753 m), weight (!) 354 lb (160.6 kg), SpO2 95 %. Body mass index is 52.28  kg/m.  General: Cooperative, alert, well developed, in no acute distress. HEENT: Conjunctivae and lids unremarkable. Cardiovascular: Regular rhythm.    Lungs: Normal work of breathing. Neurologic: No focal deficits.   Lab Results  Component Value Date   CREATININE 0.81 07/23/2019   BUN 17 07/23/2019   NA 138 07/23/2019   K 4.3 07/23/2019   CL 103 07/23/2019   CO2 28 07/23/2019   Lab Results  Component Value Date   ALT 9 07/23/2019   AST 11 07/23/2019   ALKPHOS 69 07/23/2019   BILITOT 0.5 07/23/2019   Lab Results  Component Value Date   HGBA1C 6.0 11/11/2019   No results found for: INSULIN Lab Results  Component Value Date   TSH 0.67 07/23/2019   Lab Results  Component Value Date   CHOL 174 07/23/2019   HDL 44.20 07/23/2019   LDLCALC 107 (H) 07/23/2019   TRIG 114.0 07/23/2019   CHOLHDL 4 07/23/2019   Lab Results  Component Value Date   WBC 7.1 07/23/2019   HGB 13.2 07/23/2019   HCT 39.9 07/23/2019   MCV 71.6 (L) 07/23/2019   PLT 163.0 07/23/2019   No results found for: IRON, TIBC, FERRITIN  Attestation Statements:   Reviewed by clinician on day of visit: allergies, medications, problem list, medical history, surgical history, family history, social history, and previous encounter notes.  Migdalia Dk, am acting as Location manager for [employer], DO   I have reviewed the above documentation for accuracy and completeness, and I agree with the above. Jearld Lesch, DO

## 2020-02-27 NOTE — Progress Notes (Signed)
  Echocardiogram 2D Echocardiogram has been performed.  Balin Vandegrift G Camara Renstrom 02/27/2020, 12:10 PM

## 2020-03-02 ENCOUNTER — Ambulatory Visit: Payer: 59 | Admitting: Pulmonary Disease

## 2020-03-09 ENCOUNTER — Ambulatory Visit (INDEPENDENT_AMBULATORY_CARE_PROVIDER_SITE_OTHER): Payer: 59 | Admitting: Bariatrics

## 2020-03-09 ENCOUNTER — Other Ambulatory Visit: Payer: Self-pay

## 2020-03-09 ENCOUNTER — Encounter (INDEPENDENT_AMBULATORY_CARE_PROVIDER_SITE_OTHER): Payer: Self-pay | Admitting: Bariatrics

## 2020-03-09 VITALS — BP 123/87 | HR 71 | Temp 97.8°F | Ht 69.0 in | Wt 356.0 lb

## 2020-03-09 DIAGNOSIS — I10 Essential (primary) hypertension: Secondary | ICD-10-CM

## 2020-03-09 DIAGNOSIS — R7303 Prediabetes: Secondary | ICD-10-CM | POA: Diagnosis not present

## 2020-03-09 DIAGNOSIS — Z9189 Other specified personal risk factors, not elsewhere classified: Secondary | ICD-10-CM | POA: Diagnosis not present

## 2020-03-09 DIAGNOSIS — E559 Vitamin D deficiency, unspecified: Secondary | ICD-10-CM

## 2020-03-09 DIAGNOSIS — Z6841 Body Mass Index (BMI) 40.0 and over, adult: Secondary | ICD-10-CM

## 2020-03-10 LAB — COMPREHENSIVE METABOLIC PANEL
ALT: 9 IU/L (ref 0–32)
AST: 7 IU/L (ref 0–40)
Albumin/Globulin Ratio: 1.3 (ref 1.2–2.2)
Albumin: 3.9 g/dL (ref 3.8–4.9)
Alkaline Phosphatase: 54 IU/L (ref 44–121)
BUN/Creatinine Ratio: 18 (ref 9–23)
BUN: 14 mg/dL (ref 6–24)
Bilirubin Total: 0.6 mg/dL (ref 0.0–1.2)
CO2: 25 mmol/L (ref 20–29)
Calcium: 9.1 mg/dL (ref 8.7–10.2)
Chloride: 103 mmol/L (ref 96–106)
Creatinine, Ser: 0.8 mg/dL (ref 0.57–1.00)
GFR calc Af Amer: 95 mL/min/{1.73_m2} (ref >59)
GFR calc non Af Amer: 82 mL/min/{1.73_m2} (ref >59)
Globulin, Total: 2.9 g/dL (ref 1.5–4.5)
Glucose: 90 mg/dL (ref 65–99)
Potassium: 4.4 mmol/L (ref 3.5–5.2)
Sodium: 141 mmol/L (ref 134–144)
Total Protein: 6.8 g/dL (ref 6.0–8.5)

## 2020-03-10 LAB — HEMOGLOBIN A1C
Est. average glucose Bld gHb Est-mCnc: 134 mg/dL
Hgb A1c MFr Bld: 6.3 % — ABNORMAL HIGH (ref 4.8–5.6)

## 2020-03-10 LAB — VITAMIN D 25 HYDROXY (VIT D DEFICIENCY, FRACTURES): Vit D, 25-Hydroxy: 22.8 ng/mL — ABNORMAL LOW (ref 30.0–100.0)

## 2020-03-10 LAB — INSULIN, RANDOM: INSULIN: 20.3 u[IU]/mL (ref 2.6–24.9)

## 2020-03-11 NOTE — Progress Notes (Signed)
Chief Complaint:   OBESITY Melanie Richard is here to discuss her progress with her obesity treatment plan along with follow-up of her obesity related diagnoses. Melanie Richard is on the Category 4 Plan and states she is following her eating plan approximately 0% of the time. Melanie Richard states she is walking on the treadmill 10 minutes 3 times per week and doing steps 5 minutes 2 times per week.  Today's visit was #: 6 Starting weight: 356 lbs Starting date: 11/25/2019 Today's weight: 356 lbs Today's date: 03/09/2020 Total lbs lost to date: 0 Total lbs lost since last in-office visit: 0  Interim History: Melanie Richard is up 2 lbs. She states she is not getting in enough water.  Subjective:   Vitamin D deficiency. Melanie Richard denies sun exposure.  Pre-diabetes. Melanie Richard has a diagnosis of prediabetes based on her elevated HgA1c and was informed this puts her at greater risk of developing diabetes. She continues to work on diet and exercise to decrease her risk of diabetes. She denies nausea or hypoglycemia. Melanie Richard is taking metformin.  Essential hypertension. Melanie Richard is taking HCTZ and Norvasc.  BP Readings from Last 3 Encounters:  03/09/20 123/87  02/26/20 115/65  02/21/20 122/82   Lab Results  Component Value Date   CREATININE 0.81 07/23/2019   CREATININE 0.89 03/15/2019   At risk for osteoporosis. Melanie Richard is at higher risk of osteopenia and osteoporosis due to Vitamin D deficiency.   Assessment/Plan:   Vitamin D deficiency. Low Vitamin D level contributes to fatigue and are associated with obesity, breast, and colon cancer. VITAMIN D 25 Hydroxy (Vit-D Deficiency, Fractures) level will be checked today.   Pre-diabetes. [name] will continue to work on weight loss, exercise, and decreasing simple carbohydrates to help decrease the risk of diabetes. She will continue metformin as directed. Insulin, random, Hemoglobin A1c levels will be checked today.   Essential  hypertension. Melanie Richard is working on healthy weight loss and exercise to improve blood pressure control. We will watch for signs of hypotension as she continues her lifestyle modifications. She will continue her medications as directed. Comprehensive metabolic panel will be checked today.  At risk for osteoporosis. Melanie Richard was given approximately 15 minutes of osteoporosis prevention counseling today. Melanie Richard is at risk for osteopenia and osteoporosis due to her Vitamin D deficiency. She was encouraged to take her Vitamin D and follow her higher calcium diet and increase strengthening exercise to help strengthen her bones and decrease her risk of osteopenia and osteoporosis.  Repetitive spaced learning was employed today to elicit superior memory formation and behavioral change.  Class 3 severe obesity with serious comorbidity and body mass index (BMI) of 50.0 to 59.9 in adult, unspecified obesity type ([place]).  Melanie Richard is currently in the action stage of change. As such, her goal is to continue with weight loss efforts. She has agreed to portion control and making smart choices.   She will work on meal planning, mindful eating, and increasing her water intake.  Exercise goals: Melanie Richard will continue treadmill and steps.  Behavioral modification strategies: increasing lean protein intake, decreasing simple carbohydrates, increasing vegetables, increasing water intake, decreasing eating out, no skipping meals, meal planning and cooking strategies, keeping healthy foods in the home, holiday eating strategies , celebration eating strategies, planning for success and keeping a strict food journal.  Melanie Richard has agreed to follow-up with our clinic in 3 weeks. She was informed of the importance of frequent follow-up visits to maximize her success with intensive lifestyle modifications for her  multiple health conditions.   Melanie Richard was informed we would discuss her lab results at her next visit  unless there is a critical issue that needs to be addressed sooner. Melanie Richard agreed to keep her next visit at the agreed upon time to discuss these results.  Objective:   Blood pressure 123/87, pulse 71, temperature 97.8 F (36.6 C), height 5\' 9"  (1.753 m), weight (!) 356 lb (161.5 kg), SpO2 95 %. Body mass index is 52.57 kg/m.  General: Cooperative, alert, well developed, in no acute distress. HEENT: Conjunctivae and lids unremarkable. Cardiovascular: Regular rhythm.  Lungs: Normal work of breathing. Neurologic: No focal deficits.   Lab Results  Component Value Date   INSULIN 20.3 03/09/2020   Lab Results  Component Value Date   TSH 0.67 07/23/2019   Lab Results  Component Value Date   CHOL 174 07/23/2019   HDL 44.20 07/23/2019   LDLCALC 107 (H) 07/23/2019   TRIG 114.0 07/23/2019   CHOLHDL 4 07/23/2019   Lab Results  Component Value Date   WBC 7.1 07/23/2019   HGB 13.2 07/23/2019   HCT 39.9 07/23/2019   MCV 71.6 (L) 07/23/2019   PLT 163.0 07/23/2019   No results found for: IRON, TIBC, FERRITIN  Attestation Statements:   Reviewed by clinician on day of visit: allergies, medications, problem list, medical history, surgical history, family history, social history, and previous encounter notes.  Migdalia Dk, am acting as Location manager for [employer], DO   I have reviewed the above documentation for accuracy and completeness, and I agree with the above. Jearld Lesch, DO

## 2020-03-14 ENCOUNTER — Encounter (INDEPENDENT_AMBULATORY_CARE_PROVIDER_SITE_OTHER): Payer: Self-pay | Admitting: Bariatrics

## 2020-03-16 ENCOUNTER — Other Ambulatory Visit (INDEPENDENT_AMBULATORY_CARE_PROVIDER_SITE_OTHER): Payer: Self-pay | Admitting: Bariatrics

## 2020-03-16 DIAGNOSIS — E559 Vitamin D deficiency, unspecified: Secondary | ICD-10-CM

## 2020-03-16 NOTE — Telephone Encounter (Signed)
This patient was last seen by Dr. Owens Shark, and currently has an upcoming appt scheduled on 03/30/20 with her.

## 2020-03-25 ENCOUNTER — Other Ambulatory Visit: Payer: Self-pay

## 2020-03-25 ENCOUNTER — Ambulatory Visit (INDEPENDENT_AMBULATORY_CARE_PROVIDER_SITE_OTHER): Payer: 59 | Admitting: Pulmonary Disease

## 2020-03-25 ENCOUNTER — Encounter: Payer: Self-pay | Admitting: Pulmonary Disease

## 2020-03-25 VITALS — BP 128/78 | HR 88 | Temp 97.3°F | Ht 69.0 in | Wt 350.0 lb

## 2020-03-25 DIAGNOSIS — G4733 Obstructive sleep apnea (adult) (pediatric): Secondary | ICD-10-CM | POA: Diagnosis not present

## 2020-03-25 DIAGNOSIS — Z9989 Dependence on other enabling machines and devices: Secondary | ICD-10-CM

## 2020-03-25 NOTE — Progress Notes (Signed)
Melanie Richard    [id number]    [birth year]  Primary Care Physician:Nche, Charlene Brooke, NP  Referring Physician: Flossie Buffy, NP 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 albuterol (PROVENTIL) (2.5 MG/3ML) 0.083% nebulizer solution Take 3 mLs (2.5 mg total) by nebulization every 6 (six) hours as needed for wheezing or shortness of breath.  Marland Kitchen albuterol (VENTOLIN HFA) 108 (90 Base) MCG/ACT inhaler Inhale 1-2 puffs into the lungs every 6 (six) hours as needed for wheezing or shortness of breath.  Marland Kitchen amLODipine (NORVASC) 5 MG tablet Take 1 tablet (5 mg total) by mouth daily.  Marland Kitchen aspirin 81 MG EC tablet Take 81 mg by mouth daily.   . hydrochlorothiazide (HYDRODIURIL) 12.5 MG tablet Take 1 tablet (12.5 mg total) by mouth daily. Needs office visit for additional refills  . ibuprofen (ADVIL) 600 MG tablet Take 1 tablet (600 mg total) by mouth every 8 (eight) hours as needed (with food).  . metFORMIN (GLUCOPHAGE) 500 MG tablet Take 1 tablet (500 mg total) by mouth 2 (two) times daily with a meal. With meal  . methocarbamol (ROBAXIN) 500 MG tablet Take 1  tablet (500 mg total) by mouth every 8 (eight) hours as needed for muscle spasms.  . Vitamin D, Ergocalciferol, (DRISDOL) 1.25 MG (50000 UNIT) CAPS capsule Take 1 capsule (50,000 Units total) by mouth every 7 (seven) days.   No facility-administered encounter medications on file as of 03/25/2020.    Allergies as of 03/25/2020  . (No Known Allergies)    Past Medical History:  Diagnosis Date  . Back pain   . H/O blood clots   . Hypertension   . Pacemaker   . Sleep apnea    cpap nightly    Past Surgical History:  Procedure Laterality Date  . COLONOSCOPY WITH PROPOFOL N/A 09/30/2019   Procedure: COLONOSCOPY WITH PROPOFOL;  Surgeon: Jerene Bears, MD;  Location: [facility];  Service: Gastroenterology;  Laterality: N/A;  . lymphnode removal    . PACEMAKER IMPLANT    . PACEMAKER PLACEMENT  06/29/2018  . POLYPECTOMY  09/30/2019   Procedure: POLYPECTOMY;  Surgeon: Jerene Bears, MD;  Location: [facility];  Service: Gastroenterology;;  . SKIN GRAFT      Family History  Problem Relation Age of Onset  . Hypertension Mother   . Heart disease Mother   . Hypertension Father   . Diabetes Father   . Diabetes Brother   . Intellectual disability Brother   . Cancer Maternal Aunt 40       Breast cancer  . Cancer Maternal  Uncle        kidney  . Colon cancer Neg Hx   . Esophageal cancer Neg Hx   . Stomach cancer Neg Hx   . Rectal cancer Neg Hx     Social History   Socioeconomic History  . Marital status: Divorced    Spouse name: Not on file  . Number of children: 2  . Years of education: Not on file  . Highest education level: Not on file  Occupational History  . Occupation: CNA  Tobacco Use  . Smoking status: Never Smoker  . Smokeless tobacco: Never Used  Vaping Use  . Vaping Use: Never used  Substance and Sexual Activity  . Alcohol use: Never  . Drug use: Never  . Sexual activity: Yes    Birth control/protection: Post-menopausal  Other Topics Concern  . Not on file   Social History Narrative  . Not on file   Social Determinants of Health   Financial Resource Strain:   . Difficulty of Paying Living Expenses: Not on file  Food Insecurity:   . Worried About Charity fundraiser in the Last Year: Not on file  . Ran Out of Food in the Last Year: Not on file  Transportation Needs:   . Lack of Transportation (Medical): Not on file  . Lack of Transportation (Non-Medical): Not on file  Physical Activity:   . Days of Exercise per Week: Not on file  . Minutes of Exercise per Session: Not on file  Stress:   . Feeling of Stress : Not on file  Social Connections:   . Frequency of Communication with Friends and Family: Not on file  . Frequency of Social Gatherings with Friends and Family: Not on file  . Attends Religious Services: Not on file  . Active Member of Clubs or Organizations: Not on file  . Attends Archivist Meetings: Not on file  . Marital Status: Not on file  Intimate Partner Violence:   . Fear of Current or Ex-Partner: Not on file  . Emotionally Abused: Not on file  . Physically Abused: Not on file  . Sexually Abused: Not on file    Review of Systems  Constitutional: Negative.   HENT: Negative.   Respiratory: Positive for apnea and shortness of breath.   Psychiatric/Behavioral: Negative for sleep disturbance.    Vitals:   03/25/20 1053  BP: 128/78  Pulse: 88  Temp: (!) 97.3 F (36.3 C)  SpO2: 96%     Physical Exam Constitutional:      Appearance: She is well-developed. She is obese.     Comments: Obese  HENT:     Head: Normocephalic.  Eyes:     General:        Right eye: No discharge.        Left eye: No discharge.  Neck:     Thyroid: No thyromegaly.     Trachea: No tracheal deviation.  Cardiovascular:     Rate and Rhythm: Normal rate and regular rhythm.     Pulses: Normal pulses.     Heart sounds: Normal heart sounds. No murmur heard.  No friction rub.  Pulmonary:     Effort: Pulmonary effort is  normal. No respiratory distress.     Breath sounds: Normal breath sounds. No wheezing.  Chest:     Chest wall: No tenderness.  Musculoskeletal:     Cervical back: No rigidity or tenderness.  Neurological:     Mental Status: She is alert.  Psychiatric:        Mood and Affect: Mood normal.    Epworth Sleepiness Scale of 8  Data Reviewed: Compliance down to 40% Machine set between 4 and 20 Residual AHI 1.1  Echocardiogram reviewed -Significant for diastolic dysfunction, normal systolic function  Assessment:   Moderate obstructive sleep apnea -Encourage compliance with CPAP -Encouraged to call if he has any concerns with respect to being able to tolerate CPAP  Morbid obesity -Encouraged to continue working on weight loss efforts  Plan/Recommendations: Encouraged to use CPAP on a regular basis  Encouraged adequate number of hours of sleep 6 to 8 hours-this is affected by her ability to sleep during the day and work schedule  Follow-up in 6 months  She is to call with any significant concerns  Sherrilyn Rist MD [facility] 03/25/2020, 11:07 AM  CC: Nche, Charlene Brooke, NP

## 2020-03-25 NOTE — Patient Instructions (Signed)
Encourage you to use CPAP on a regular basis  Continue weight loss efforts  Regular exercise as tolerated  I will see you back in 6 months  Call with significant concerns

## 2020-03-30 ENCOUNTER — Ambulatory Visit (INDEPENDENT_AMBULATORY_CARE_PROVIDER_SITE_OTHER): Payer: 59 | Admitting: Bariatrics

## 2020-04-19 ENCOUNTER — Other Ambulatory Visit: Payer: Self-pay | Admitting: Nurse Practitioner

## 2020-04-20 ENCOUNTER — Telehealth (INDEPENDENT_AMBULATORY_CARE_PROVIDER_SITE_OTHER): Payer: Self-pay | Admitting: Bariatrics

## 2020-04-20 DIAGNOSIS — I10 Essential (primary) hypertension: Secondary | ICD-10-CM

## 2020-04-20 DIAGNOSIS — Z6841 Body Mass Index (BMI) 40.0 and over, adult: Secondary | ICD-10-CM

## 2020-04-21 ENCOUNTER — Encounter (INDEPENDENT_AMBULATORY_CARE_PROVIDER_SITE_OTHER): Payer: Self-pay | Admitting: Bariatrics

## 2020-04-21 NOTE — Telephone Encounter (Signed)
Last OV 12/31/19 Last fill 10/17/19  #90/1

## 2020-04-21 NOTE — Progress Notes (Signed)
TeleHealth Visit:  Due to the COVID-19 pandemic, this visit was completed with telemedicine (audio/video) technology to reduce patient and provider exposure as well as to preserve personal protective equipment.   Cardelia has verbally consented to this TeleHealth visit. The patient is located at home, the provider is located at the [facility] office. The participants in this visit include the listed provider and patient. The visit was conducted today via telephone.  Jordian was unable to use realtime audiovisual technology today and the telehealth visit was conducted via telephone.  Chief Complaint: OBESITY Lylianah is here to discuss her progress with her obesity treatment plan along with follow-up of her obesity related diagnoses. Patrise is on practicing portion control and making smarter food choices, such as increasing vegetables and decreasing simple carbohydrates and states she is following her eating plan approximately 0% of the time. Ebunoluwa states she is not exercising regularly.  Today's visit was #: 7 Starting weight: 356 lbs Starting date: 11/25/2019  Interim History: Tyreka states that she possibly has Covid-19.  She thinks that she has lost 3 pounds.  Subjective:   1. Essential hypertension Kamoria is taking her medications as instructed.  BP Readings from Last 3 Encounters:  03/25/20 128/78  03/09/20 123/87  02/26/20 115/65   Assessment/Plan:   1. Essential hypertension Jackilyn is working on healthy weight loss and exercise to improve blood pressure control. We will watch for signs of hypotension as she continues her lifestyle modifications.  Continue medications.  2. Class 3 severe obesity with serious comorbidity and body mass index (BMI) of 50.0 to 59.9 in adult, unspecified obesity type ([place])  Deyanira is currently in the action stage of change. As such, her goal is to continue with weight loss efforts. She has agreed to  practicing portion control and making smarter food choices, such as increasing vegetables and decreasing simple carbohydrates.   She will work on meal planning and mindful eating.  Exercise goals: All adults should avoid inactivity. Some physical activity is better than none, and adults who participate in any amount of physical activity gain some health benefits.  Behavioral modification strategies: increasing lean protein intake, decreasing simple carbohydrates, increasing vegetables, increasing water intake, decreasing eating out, no skipping meals, meal planning and cooking strategies, keeping healthy foods in the home, dealing with family or coworker sabotage, celebration eating strategies, planning for success and keeping a strict food journal.  Juanna has agreed to follow-up with our clinic in 2-3 weeks. She was informed of the importance of frequent follow-up visits to maximize her success with intensive lifestyle modifications for her multiple health conditions.  Objective:   VITALS: Per patient if applicable, see vitals. GENERAL: Alert and in no acute distress. CARDIOPULMONARY: No increased WOB. Speaking in clear sentences.  PSYCH: Pleasant and cooperative. Speech normal rate and rhythm. Affect is appropriate. Insight and judgement are appropriate. Attention is focused, linear, and appropriate.  NEURO: Oriented as arrived to appointment on time with no prompting.   Lab Results  Component Value Date   CREATININE 0.80 03/09/2020   BUN 14 03/09/2020   NA 141 03/09/2020   K 4.4 03/09/2020   CL 103 03/09/2020   CO2 25 03/09/2020   Lab Results  Component Value Date   ALT 9 03/09/2020   AST 7 03/09/2020   ALKPHOS 54 03/09/2020   BILITOT 0.6 03/09/2020   Lab Results  Component Value Date   HGBA1C 6.3 (H) 03/09/2020   HGBA1C 6.0 11/11/2019   Lab  Results  Component Value Date   INSULIN 20.3 03/09/2020   Lab Results  Component Value Date   TSH 0.67 07/23/2019   Lab  Results  Component Value Date   CHOL 174 07/23/2019   HDL 44.20 07/23/2019   LDLCALC 107 (H) 07/23/2019   TRIG 114.0 07/23/2019   CHOLHDL 4 07/23/2019   Lab Results  Component Value Date   WBC 7.1 07/23/2019   HGB 13.2 07/23/2019   HCT 39.9 07/23/2019   MCV 71.6 (L) 07/23/2019   PLT 163.0 07/23/2019   Attestation Statements:   Reviewed by clinician on day of visit: allergies, medications, problem list, medical history, surgical history, family history, social history, and previous encounter notes.  I, Insurance claims handler, CMA, am acting as Energy manager for [employer], DO  I have reviewed the above documentation for accuracy and completeness, and I agree with the above. Corinna Capra, DO

## 2020-04-22 ENCOUNTER — Telehealth: Payer: Self-pay | Admitting: Nurse Practitioner

## 2020-04-22 DIAGNOSIS — U071 COVID-19: Secondary | ICD-10-CM

## 2020-04-22 MED ORDER — BENZONATATE 100 MG PO CAPS: 100.0000 mg | ORAL_CAPSULE | Freq: Three times a day (TID) | ORAL | 0 refills | Status: DC | PRN

## 2020-04-22 NOTE — Telephone Encounter (Signed)
Patient tested positive for COVID, has a consistent cough and wants to know if her PCP would call her in something for the cough. If approved, [facility] [place] and call her at [phone] to let her know that it has been sent in.

## 2020-04-23 ENCOUNTER — Other Ambulatory Visit (INDEPENDENT_AMBULATORY_CARE_PROVIDER_SITE_OTHER): Payer: Self-pay | Admitting: Bariatrics

## 2020-04-23 DIAGNOSIS — R7303 Prediabetes: Secondary | ICD-10-CM

## 2020-04-23 NOTE — Telephone Encounter (Signed)
Dr.Brown 

## 2020-05-12 ENCOUNTER — Other Ambulatory Visit (INDEPENDENT_AMBULATORY_CARE_PROVIDER_SITE_OTHER): Payer: Self-pay | Admitting: Bariatrics

## 2020-05-12 DIAGNOSIS — R7303 Prediabetes: Secondary | ICD-10-CM

## 2020-06-01 ENCOUNTER — Encounter: Payer: Self-pay | Admitting: Nurse Practitioner

## 2020-06-01 ENCOUNTER — Other Ambulatory Visit: Payer: Self-pay

## 2020-06-01 ENCOUNTER — Ambulatory Visit: Payer: 59 | Admitting: Nurse Practitioner

## 2020-06-01 DIAGNOSIS — Z6841 Body Mass Index (BMI) 40.0 and over, adult: Secondary | ICD-10-CM

## 2020-06-01 NOTE — Progress Notes (Signed)
Subjective:  Patient ID: Melanie Richard, female    DOB: 09-02-1960  Age: 60 y.o. MRN: [id number]  CC: Follow-up (Pt in need of surgery clearance (Bariatric Surgery)and has paperwork that needs to be completed. )  HPI  Morbid obesity (Melanie Richard) Melanie Richard needs form completed for Bariatric clinic. She wants to move forward with gastric sleeve or Roux en Y procedure. For the last 36months, she was a patient with [facility] but unable to loss any weight despite decreasing calorie intake and daily exercise. She is awre of the need for cardiology clearance, so she scheduled an appt next month.. Her form was completed and return to her today. Today's BMI of 52.54 Wt Readings from Last 3 Encounters:  06/01/20 (!) 355 lb 12.8 oz (161.4 kg)  03/25/20 (!) 350 lb (158.8 kg)  03/09/20 (!) 356 lb (161.5 kg)   BP Readings from Last 3 Encounters:  06/01/20 130/90  03/25/20 128/78  03/09/20 123/87   HgbA1c of 6.3 TSH of 0.67 Normal CMP.  Lipid Panel     Component Value Date/Time   CHOL 174 07/23/2019 0856   TRIG 114.0 07/23/2019 0856   HDL 44.20 07/23/2019 0856   CHOLHDL 4 07/23/2019 0856   VLDL 22.8 07/23/2019 0856   LDLCALC 107 (H) 07/23/2019 0856   Reviewed past Medical, Social and Family history today.  Outpatient Medications Prior to Visit  Medication Sig Dispense Refill  . albuterol (PROVENTIL) (2.5 MG/3ML) 0.083% nebulizer solution Take 3 mLs (2.5 mg total) by nebulization every 6 (six) hours as needed for wheezing or shortness of breath. 75 mL 12  . albuterol (VENTOLIN HFA) 108 (90 Base) MCG/ACT inhaler Inhale 1-2 puffs into the lungs every 6 (six) hours as needed for wheezing or shortness of breath. 8 g 0  . amLODipine (NORVASC) 5 MG tablet Take 1 tablet (5 mg total) by mouth daily. 30 tablet 11  . aspirin 81 MG EC tablet Take 81 mg by mouth daily.     . hydrochlorothiazide (HYDRODIURIL) 12.5 MG tablet TAKE 1 TABLET (12.5 MG TOTAL) BY MOUTH DAILY. NEEDS OFFICE  VISIT FOR ADDITIONAL REFILLS 90 tablet 1  . ibuprofen (ADVIL) 600 MG tablet Take 1 tablet (600 mg total) by mouth every 8 (eight) hours as needed (with food). 30 tablet 0  . metFORMIN (GLUCOPHAGE) 500 MG tablet Take 1 tablet (500 mg total) by mouth 2 (two) times daily with a meal. With meal 60 tablet 0  . methocarbamol (ROBAXIN) 500 MG tablet Take 1 tablet (500 mg total) by mouth every 8 (eight) hours as needed for muscle spasms. 21 tablet 0  . Vitamin D, Ergocalciferol, (DRISDOL) 1.25 MG (50000 UNIT) CAPS capsule Take 1 capsule (50,000 Units total) by mouth every 7 (seven) days. 4 capsule 0  . benzonatate (TESSALON) 100 MG capsule Take 1-2 capsules (100-200 mg total) by mouth 3 (three) times daily as needed. (Patient not taking: Reported on 06/01/2020) 20 capsule 0   No facility-administered medications prior to visit.    ROS See HPI  Objective:  BP 130/90 (BP Location: Left Arm, Patient Position: Sitting, Cuff Size: Large)   Pulse 64   Temp 97.7 F (36.5 C) (Temporal)   Ht 5\' 9"  (1.753 m)   Wt (!) 355 lb 12.8 oz (161.4 kg)   SpO2 98%   BMI 52.54 kg/m   Physical Exam Constitutional:      Appearance: She is obese.  Cardiovascular:     Rate and Rhythm: Normal rate.  Pulses: Normal pulses.  Pulmonary:     Effort: Pulmonary effort is normal.  Musculoskeletal:     Right lower leg: No edema.     Left lower leg: No edema.  Neurological:     Mental Status: She is alert and oriented to person, place, and time.  Psychiatric:        Mood and Affect: Mood normal.        Behavior: Behavior normal.        Thought Content: Thought content normal.    Assessment & Plan:  This visit occurred during the SARS-CoV-2 public health emergency.  Safety protocols were in place, including screening questions prior to the visit, additional usage of staff PPE, and extensive cleaning of exam room while observing appropriate contact time as indicated for disinfecting solutions.   Melanie Richard was seen  today for follow-up.  Diagnoses and all orders for this visit:  Morbid obesity ([place])  BMI 50.0-59.9, adult ([place])   Problem List Items Addressed This Visit      Other   Morbid obesity ([place]) - Primary    Melanie Richard needs form completed for Bariatric clinic. She wants to move forward with gastric sleeve or Roux en Y procedure. For the last 3months, she was a patient with [facility] but unable to loss any weight despite decreasing calorie intake and daily exercise. She is awre of the need for cardiology clearance, so she scheduled an appt next month.. Her form was completed and return to her today. Today's BMI of 52.54 Wt Readings from Last 3 Encounters:  06/01/20 (!) 355 lb 12.8 oz (161.4 kg)  03/25/20 (!) 350 lb (158.8 kg)  03/09/20 (!) 356 lb (161.5 kg)   BP Readings from Last 3 Encounters:  06/01/20 130/90  03/25/20 128/78  03/09/20 123/87   HgbA1c of 6.3 TSH of 0.67 Normal CMP.  Lipid Panel     Component Value Date/Time   CHOL 174 07/23/2019 0856   TRIG 114.0 07/23/2019 0856   HDL 44.20 07/23/2019 0856   CHOLHDL 4 07/23/2019 0856   VLDL 22.8 07/23/2019 0856   LDLCALC 107 (H) 07/23/2019 0856         Other Visit Diagnoses    BMI 50.0-59.9, adult ([place])          Follow-up: Return in about 6 months (around 11/29/2020) for CPE (fasting).  Wilfred Lacy, NP

## 2020-06-01 NOTE — Patient Instructions (Signed)
Wish you the best with your surgery Maintain upcoming appt with cardiology for surgical clearance.

## 2020-06-01 NOTE — Assessment & Plan Note (Addendum)
Melanie Richard needs form completed for Bariatric clinic. She wants to move forward with gastric sleeve or Roux en Y procedure. For the last 51months, she was a patient with [facility] but unable to loss any weight despite decreasing calorie intake and daily exercise. She is awre of the need for cardiology clearance, so she scheduled an appt next month.. Her form was completed and return to her today. Today's BMI of 52.54 Wt Readings from Last 3 Encounters:  06/01/20 (!) 355 lb 12.8 oz (161.4 kg)  03/25/20 (!) 350 lb (158.8 kg)  03/09/20 (!) 356 lb (161.5 kg)   BP Readings from Last 3 Encounters:  06/01/20 130/90  03/25/20 128/78  03/09/20 123/87   HgbA1c of 6.3 TSH of 0.67 Normal CMP.  Lipid Panel     Component Value Date/Time   CHOL 174 07/23/2019 0856   TRIG 114.0 07/23/2019 0856   HDL 44.20 07/23/2019 0856   CHOLHDL 4 07/23/2019 0856   VLDL 22.8 07/23/2019 0856   LDLCALC 107 (H) 07/23/2019 0761

## 2020-11-15 ENCOUNTER — Other Ambulatory Visit: Payer: Self-pay | Admitting: Nurse Practitioner

## 2020-12-23 ENCOUNTER — Other Ambulatory Visit: Payer: Self-pay | Admitting: Nurse Practitioner

## 2020-12-23 DIAGNOSIS — I10 Essential (primary) hypertension: Secondary | ICD-10-CM

## 2020-12-28 ENCOUNTER — Other Ambulatory Visit: Payer: Self-pay

## 2020-12-28 NOTE — Telephone Encounter (Signed)
Pt calling again about refill on HCTZ 12.'5mg'$  1/day. She is now out of medication. She isn't sure why it hasn't been filled. Last OV 05/2020  Pt has been on this for a long time.   [id number]  [phone] Fax:  [phone]

## 2021-02-26 ENCOUNTER — Other Ambulatory Visit: Payer: Self-pay | Admitting: Nurse Practitioner

## 2021-02-26 DIAGNOSIS — I10 Essential (primary) hypertension: Secondary | ICD-10-CM

## 2021-03-17 NOTE — Telephone Encounter (Signed)
Chart supports Rx Last seen 05/2020 No future appointments scheduled

## 2021-05-28 IMAGING — CT CT RENAL STONE PROTOCOL
2 of 4 series · 15 of 46 positions shown, 17 images · non-contrast
Comparison: None.

CLINICAL DATA: Bilateral low back pain, intermittent for 3 weeks.

EXAM:
CT ABDOMEN AND PELVIS WITHOUT CONTRAST
TECHNIQUE: Multidetector CT imaging of the abdomen and pelvis was performed
following the standard protocol without IV contrast.

[Series 3: stone study 5.0 br38 1 · axial · 0.79mm/px · z∈[-562,-77]mm · 12 of 111 slices shown, 14 images]
[im 9/111  soft-tissue]
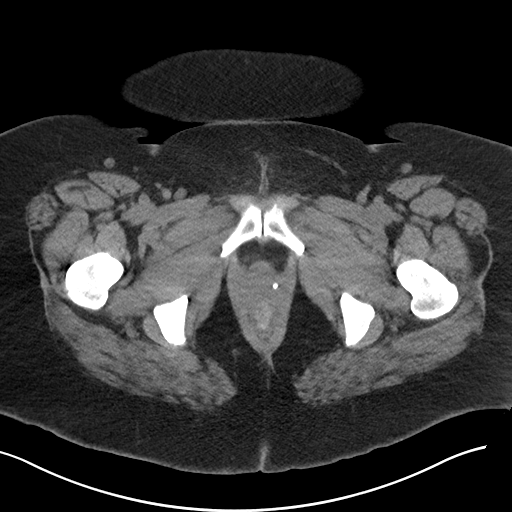
[im 9/111  bone]
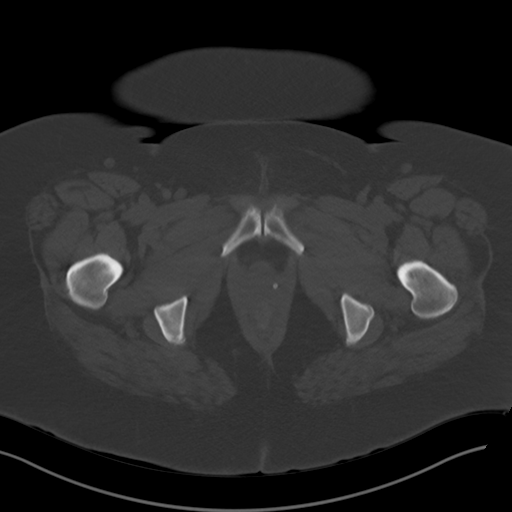
[im 18/111  soft-tissue]
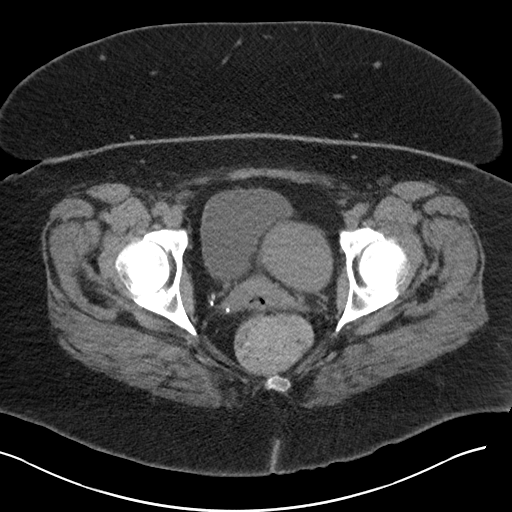
[im 27/111  soft-tissue]
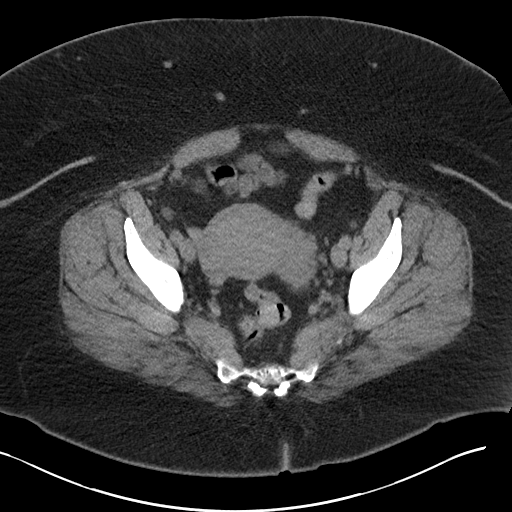
[im 36/111  soft-tissue]
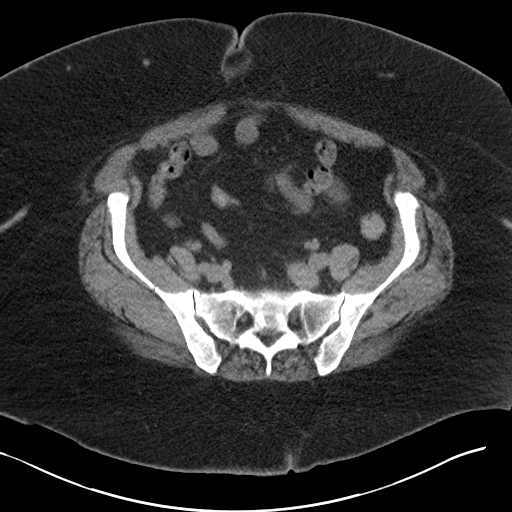
[im 45/111  soft-tissue]
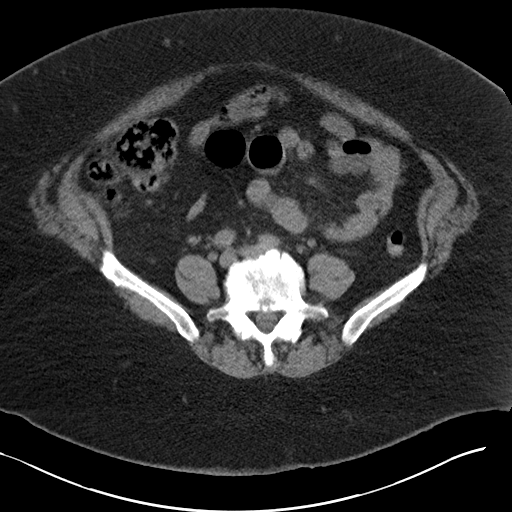
[im 53/111  soft-tissue]
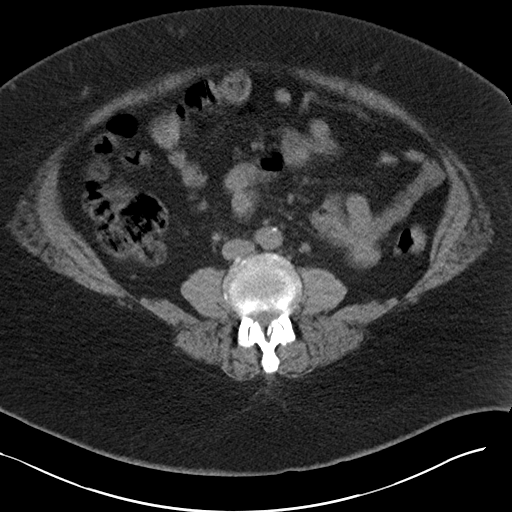
[im 62/111  soft-tissue]
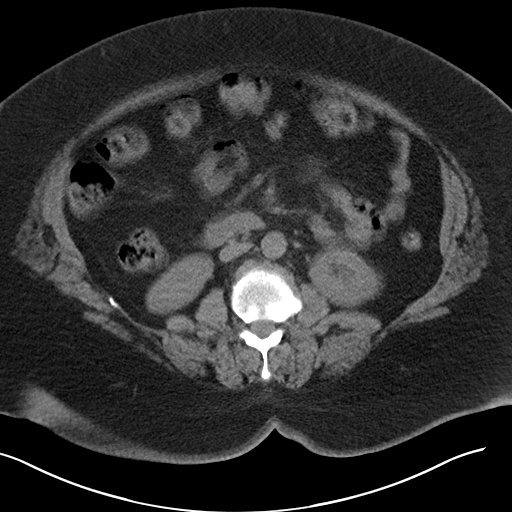
[im 71/111  soft-tissue]
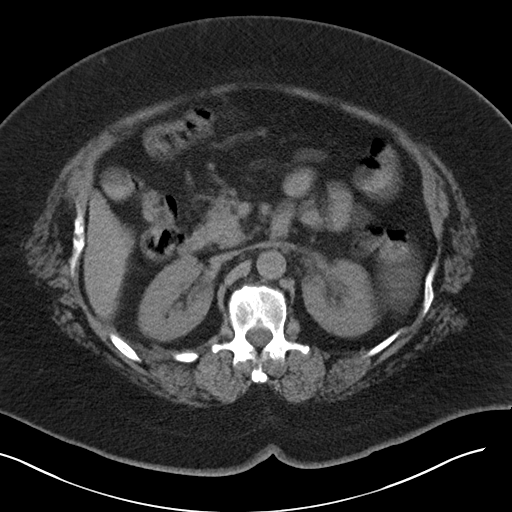
[im 80/111  soft-tissue]
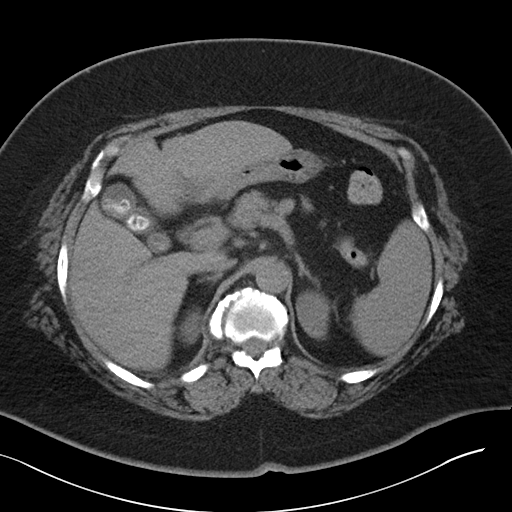
[im 80/111  bone]
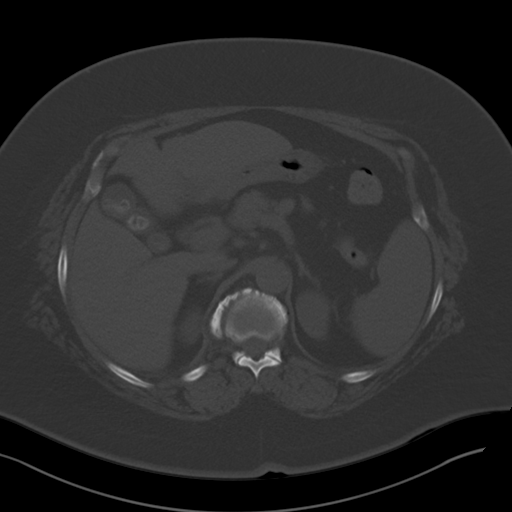
[im 89/111  soft-tissue]
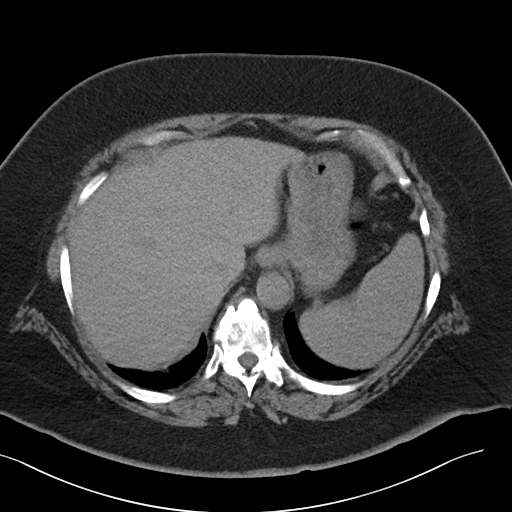
[im 97/111  soft-tissue]
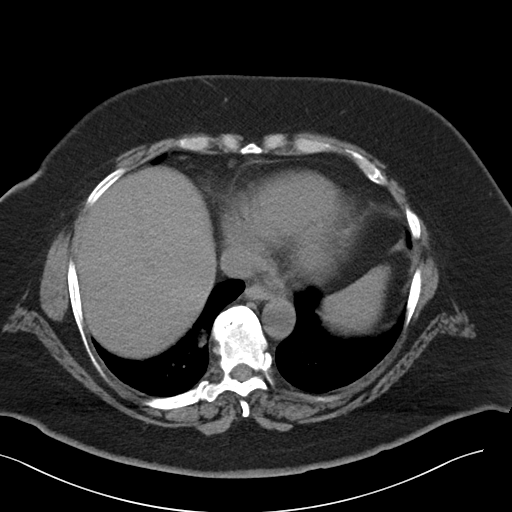
[im 106/111  soft-tissue]
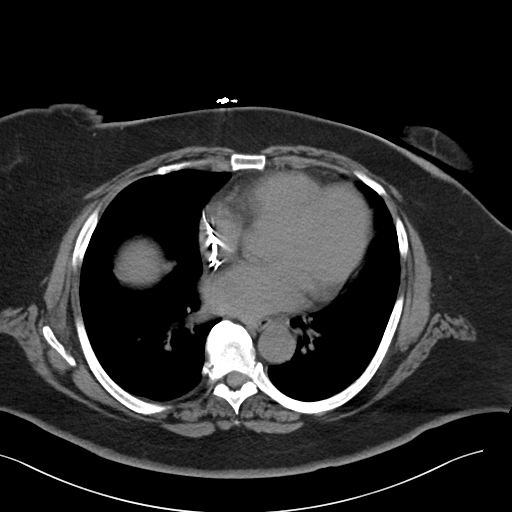

[Series 6: coronal soft tissue · coronal · 0.76mm/px · 3 of 110 slices shown]
[im 37/110  soft-tissue]
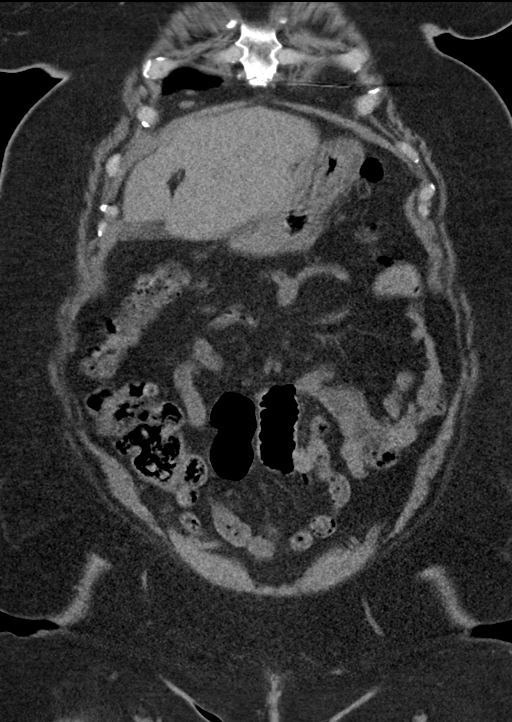
[im 49/110  soft-tissue]
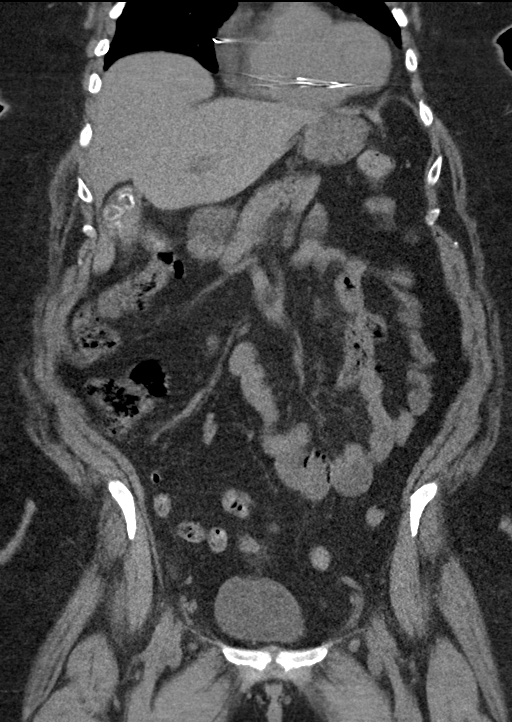
[im 61/110  soft-tissue]
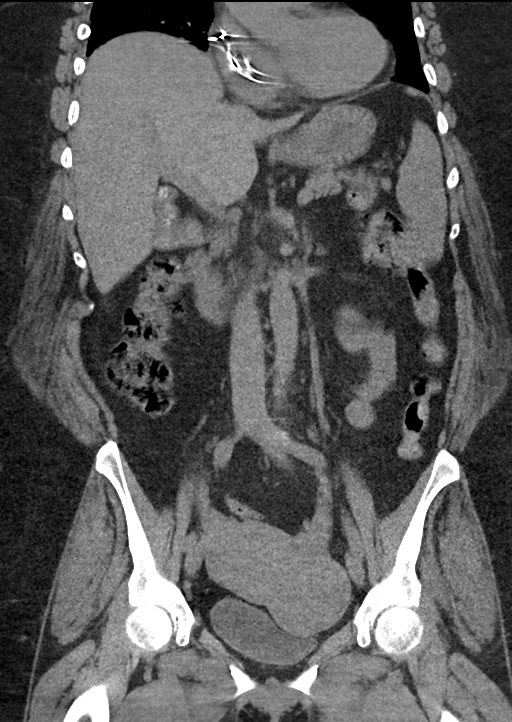

[15 of 46 positions shown; findings below may reference images not displayed]

FINDINGS: Lower chest:  Dual-chamber pacer leads on the right.

Rounded low-density nodule at the right posterior costophrenic
sulcus measuring 2.4 cm. There is close association with the
diaphragm but the density is lower than liver and greater than fat.
Two subpleural nodular densities also seen along the more cranial
subpleural right lung, subcentimeter.

Hepatobiliary: No focal liver abnormality.Multiple calcified
gallstones. No superimposed right upper quadrant inflammation

Pancreas: Unremarkable.

Spleen: Unremarkable.

Adrenals/Urinary Tract: Negative adrenals. No hydronephrosis or
stone. Unremarkable bladder.

Stomach/Bowel:  No obstruction. No appendicitis.

Vascular/Lymphatic: No acute vascular abnormality. No mass or
adenopathy.

Reproductive:6.7 cm presumed fibroid which is subserosal and
exophytic towards the left.

Other: No ascites or pneumoperitoneum. Small midline fatty hernias,
especially at the umbilicus.

Musculoskeletal: No acute abnormalities. Diffuse spondylitic
spurring with L5-S1 disc narrowing. There is multilevel thoracic
ankylosis.

These results will be called to the ordering clinician or
representative by the Radiologist Assistant, and communication
documented in the PACS or [REDACTED].
IMPRESSION: 1. No acute finding, including hydronephrosis/ureteral calculus.
2. Subpleural nodules in the right lower lobe, including a 2.4 cm
nodule at the costophrenic sulcus. Assuming no outside comparison,
recommend chest CT with contrast.
3. Subserosal fibroid measuring nearly 7 cm.
4. Cholelithiasis.

## 2021-06-01 ENCOUNTER — Other Ambulatory Visit: Payer: Self-pay | Admitting: Nurse Practitioner

## 2021-06-01 DIAGNOSIS — I10 Essential (primary) hypertension: Secondary | ICD-10-CM

## 2021-06-11 IMAGING — CT CT CHEST W/ CM
1 series · 15 of 34 positions shown, 19 images · IV contrast (iopamidol)
Comparison: Abdominal CT scan 01/01/2020

CLINICAL DATA: Evaluate pulmonary nodule seen on recent abdominal
CT scan.

EXAM:
CT CHEST WITH CONTRAST
TECHNIQUE: Multidetector CT imaging of the chest was performed during
intravenous contrast administration.
CONTRAST:  100mL [id number] IOPAMIDOL ([id number]) INJECTION 61%

[Series 3: chest w/cm · axial · 0.98mm/px · z∈[-255,-1]mm · 15 of 151 slices shown, 19 images]
[im 12/151  mediastinal]
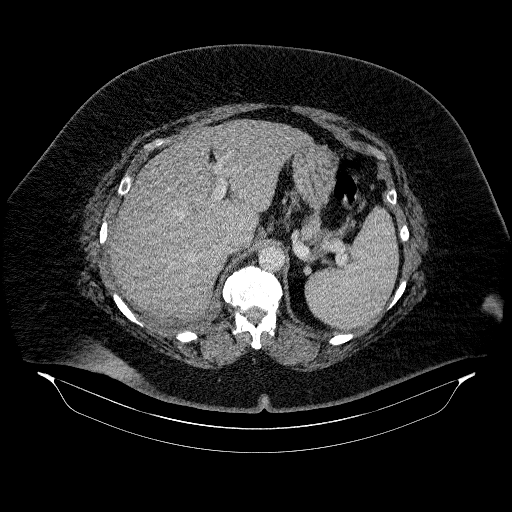
[im 12/151  lung]
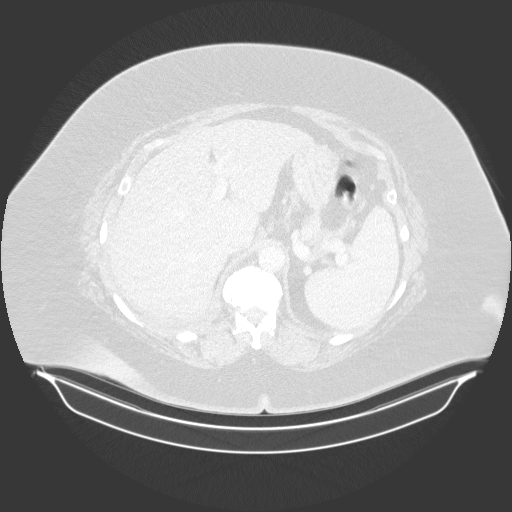
[im 23/151  lung]
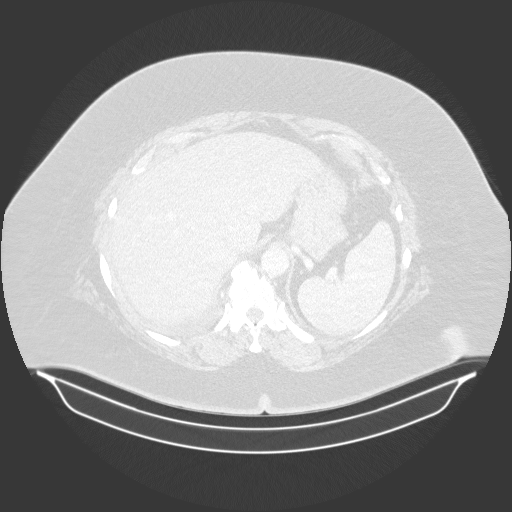
[im 31/151  lung]
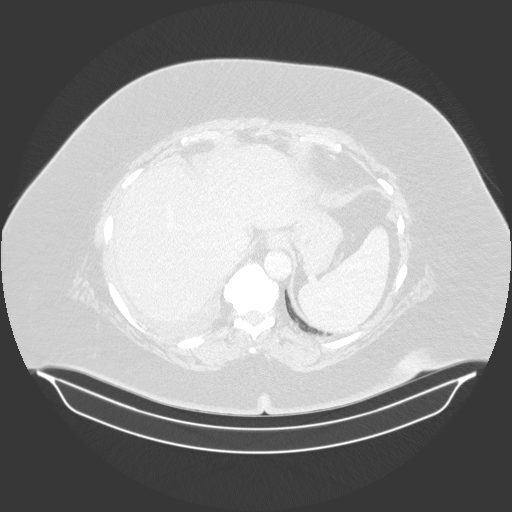
[im 39/151  lung]
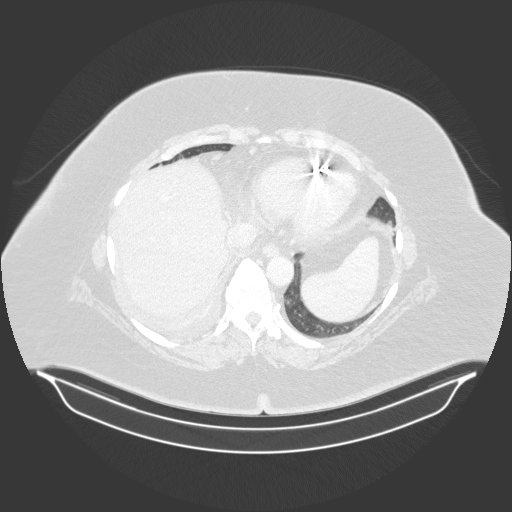
[im 51/151  mediastinal]
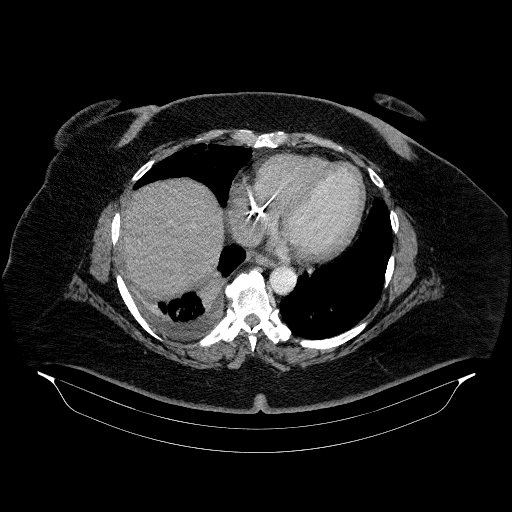
[im 51/151  lung]
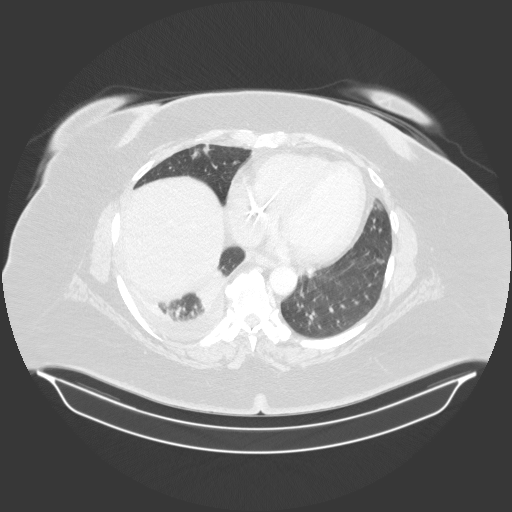
[im 61/151  lung]
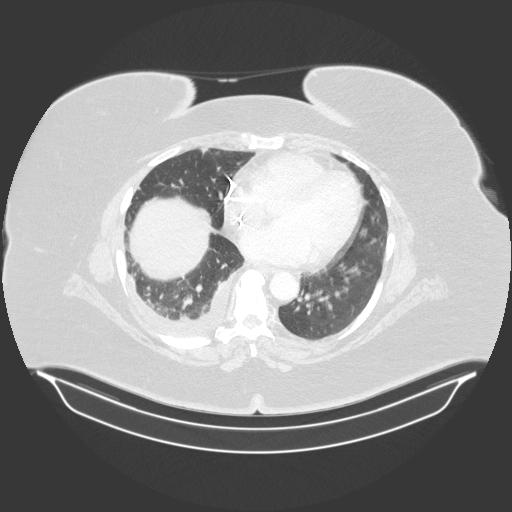
[im 67/151  lung]
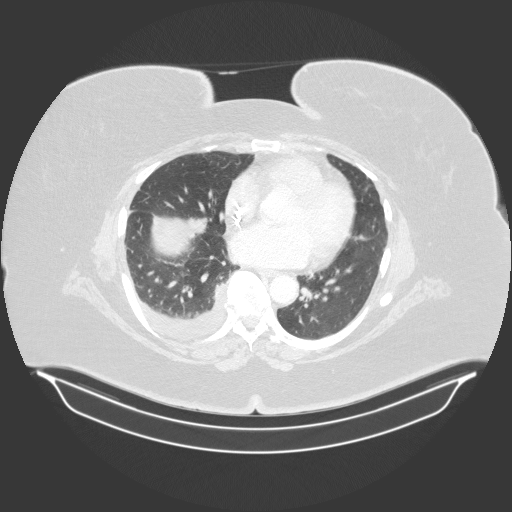
[im 78/151  lung]
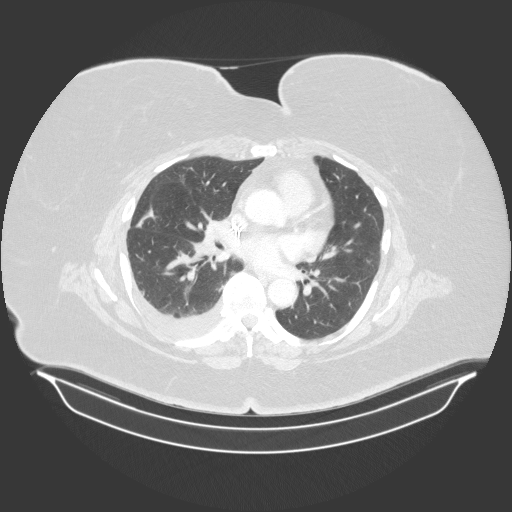
[im 84/151  mediastinal]
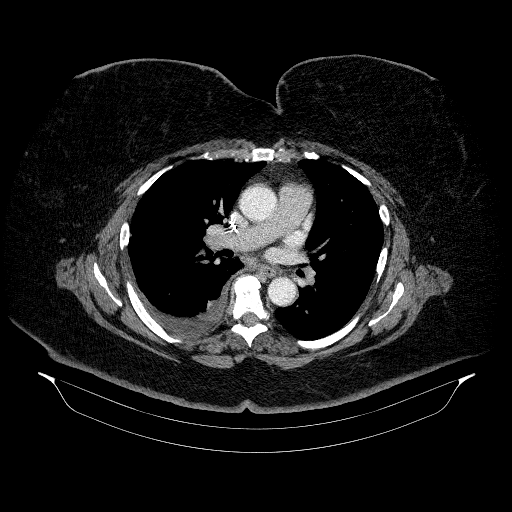
[im 84/151  lung]
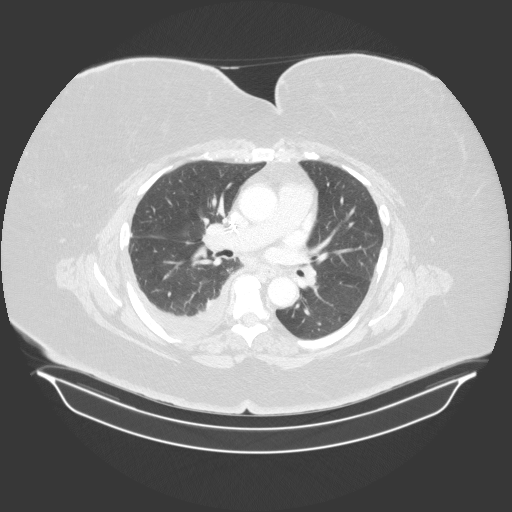
[im 91/151  lung]
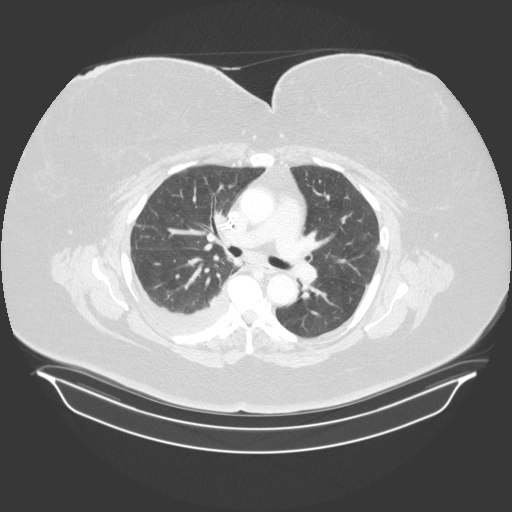
[im 101/151  lung]
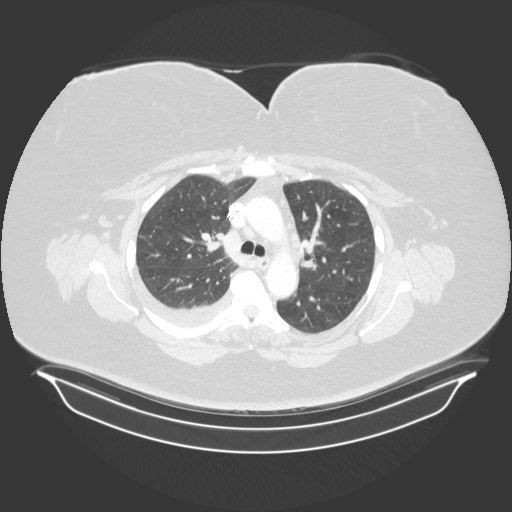
[im 112/151  lung]
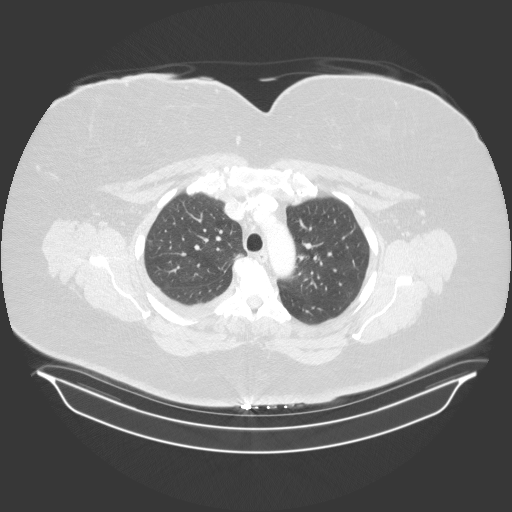
[im 121/151  mediastinal]
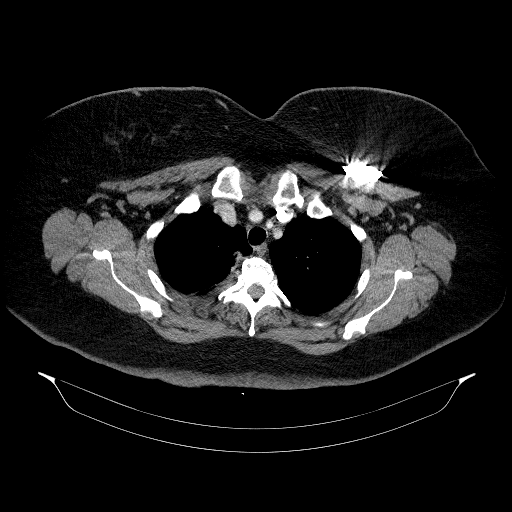
[im 121/151  lung]
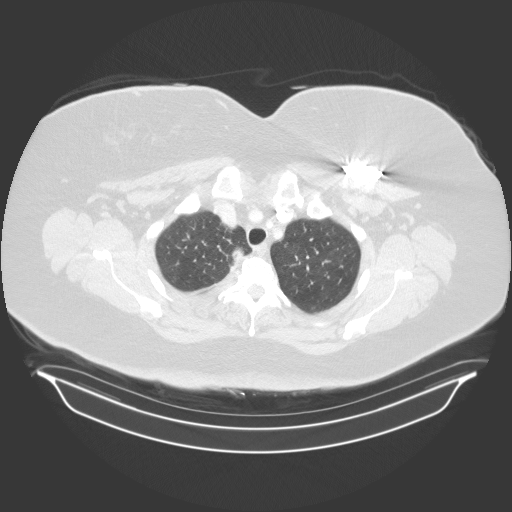
[im 128/151  lung]
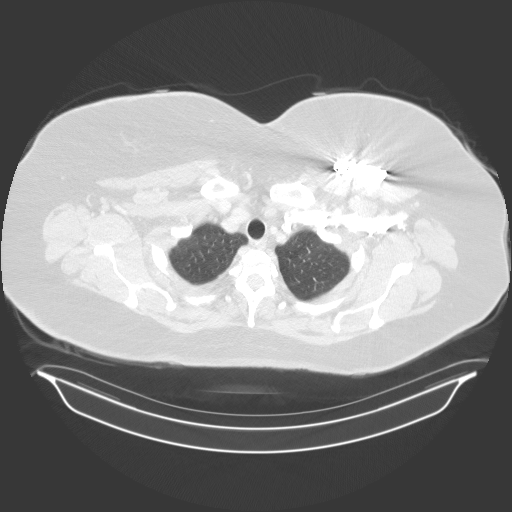
[im 139/151  lung]
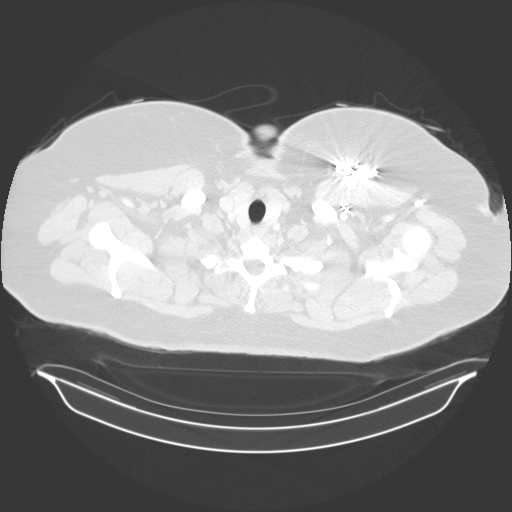

[15 of 34 positions shown; findings below may reference images not displayed]

FINDINGS: Cardiovascular: The heart is within normal limits in size. No
pericardial effusion. The aorta is normal in caliber. No dissection.
No atherosclerotic calcifications. No definite coronary artery
calcifications.

Pacer wires in good position without complicating features. The
pulmonary arteries are grossly normal.

Mediastinum/Nodes: Borderline mediastinal lymph nodes. 10.5 mm
precarinal lymph node on image 55/3. 10.5 mm subcarinal lymph node
on image 61/3. Other small scattered sub 5 mm lymph nodes. These are
likely reactive as there are some inflammatory changes in the lungs
and small right pleural effusion. The esophagus is grossly normal.

Lungs/Pleura: There is a new small right pleural effusion with
overlying atelectasis. This somewhat obscures the right basilar lung
nodules. Other patchy areas of vague subpleural nodularity likely
representing atelectasis. I do not see any worrisome pulmonary
lesions. No pulmonary edema. No bronchiectasis.

Upper Abdomen: Slightly irregular liver contour and prominent
hepatic fissures may suggest changes of cirrhosis. The spleen is
also mildly enlarged it measures approximately 14 x 13 x 9.5 cm. No
hepatic lesions. A few upper abdominal lymph nodes and an epicardial
node can be seen with cirrhosis.

Musculoskeletal: No breast masses, supraclavicular or axillary
adenopathy. The thyroid gland is grossly normal. The bony structures
are intact. Degenerative changes involving the thoracic spine with
large bridging anterior and right lateral osteophytes.
IMPRESSION: 1. New small right pleural effusion with overlying atelectasis. This
somewhat obscures the right basilar nodules.
2. Patchy inflammatory changes and areas of subpleural atelectasis.
Recommend follow-up noncontrast chest CT in 4-6 months to reassess.
3. Borderline mediastinal lymph nodes, likely reactive.
4. No worrisome pulmonary lesions.
5. Suspect changes of cirrhosis with associated splenomegaly.

## 2021-09-05 ENCOUNTER — Other Ambulatory Visit: Payer: Self-pay | Admitting: Nurse Practitioner

## 2021-09-05 DIAGNOSIS — I10 Essential (primary) hypertension: Secondary | ICD-10-CM

## 2021-09-06 NOTE — Telephone Encounter (Signed)
Next Ov 09/2021

## 2021-10-11 ENCOUNTER — Encounter: Payer: Self-pay | Admitting: Nurse Practitioner

## 2021-10-11 ENCOUNTER — Ambulatory Visit (INDEPENDENT_AMBULATORY_CARE_PROVIDER_SITE_OTHER): Payer: Self-pay | Admitting: Nurse Practitioner

## 2021-10-11 VITALS — BP 138/88 | HR 60 | Temp 97.5°F | Ht 69.0 in | Wt 259.4 lb

## 2021-10-11 DIAGNOSIS — Z136 Encounter for screening for cardiovascular disorders: Secondary | ICD-10-CM

## 2021-10-11 DIAGNOSIS — Z0001 Encounter for general adult medical examination with abnormal findings: Secondary | ICD-10-CM

## 2021-10-11 DIAGNOSIS — R7303 Prediabetes: Secondary | ICD-10-CM

## 2021-10-11 DIAGNOSIS — Z13220 Encounter for screening for lipoid disorders: Secondary | ICD-10-CM

## 2021-10-11 DIAGNOSIS — R918 Other nonspecific abnormal finding of lung field: Secondary | ICD-10-CM

## 2021-10-11 DIAGNOSIS — I10 Essential (primary) hypertension: Secondary | ICD-10-CM

## 2021-10-11 DIAGNOSIS — I5032 Chronic diastolic (congestive) heart failure: Secondary | ICD-10-CM

## 2021-10-11 DIAGNOSIS — I495 Sick sinus syndrome: Secondary | ICD-10-CM

## 2021-10-11 DIAGNOSIS — Z1231 Encounter for screening mammogram for malignant neoplasm of breast: Secondary | ICD-10-CM

## 2021-10-11 DIAGNOSIS — E559 Vitamin D deficiency, unspecified: Secondary | ICD-10-CM

## 2021-10-11 LAB — COMPREHENSIVE METABOLIC PANEL
ALT: 12 U/L (ref 0–35)
AST: 10 U/L (ref 0–37)
Albumin: 3.8 g/dL (ref 3.5–5.2)
Alkaline Phosphatase: 55 U/L (ref 39–117)
BUN: 20 mg/dL (ref 6–23)
CO2: 29 mEq/L (ref 19–32)
Calcium: 9.2 mg/dL (ref 8.4–10.5)
Chloride: 106 mEq/L (ref 96–112)
Creatinine, Ser: 0.71 mg/dL (ref 0.40–1.20)
GFR: 93.45 mL/min (ref >60.00)
Glucose, Bld: 92 mg/dL (ref 70–99)
Potassium: 3.9 mEq/L (ref 3.5–5.1)
Sodium: 142 mEq/L (ref 135–145)
Total Bilirubin: 1 mg/dL (ref 0.2–1.2)
Total Protein: 6 g/dL (ref 6.0–8.3)

## 2021-10-11 LAB — CBC
HCT: 37.8 % (ref 36.0–46.0)
Hemoglobin: 12.5 g/dL (ref 12.0–15.0)
MCHC: 33.1 g/dL (ref 30.0–36.0)
MCV: 75.1 fl — ABNORMAL LOW (ref 78.0–100.0)
Platelets: 157 10*3/uL (ref 150.0–400.0)
RBC: 5.04 Mil/uL (ref 3.87–5.11)
RDW: 15.4 % (ref 11.5–15.5)
WBC: 5.1 10*3/uL (ref 4.0–10.5)

## 2021-10-11 LAB — LIPID PANEL
Cholesterol: 166 mg/dL (ref 0–200)
HDL: 48 mg/dL (ref >39.00)
LDL Cholesterol: 97 mg/dL (ref 0–99)
NonHDL: 118.07
Total CHOL/HDL Ratio: 3
Triglycerides: 107 mg/dL (ref 0.0–149.0)
VLDL: 21.4 mg/dL (ref 0.0–40.0)

## 2021-10-11 LAB — VITAMIN D 25 HYDROXY (VIT D DEFICIENCY, FRACTURES): VITD: 48.7 ng/mL (ref 30.00–100.00)

## 2021-10-11 LAB — HEMOGLOBIN A1C: Hgb A1c MFr Bld: 5.6 % (ref 4.6–6.5)

## 2021-11-04 ENCOUNTER — Ambulatory Visit
Admission: RE | Admit: 2021-11-04 | Discharge: 2021-11-04 | Disposition: A | Discharge status: Home / Self Care | Payer: Self-pay | Source: Ambulatory Visit | Attending: Nurse Practitioner | Admitting: Nurse Practitioner

## 2021-11-04 ENCOUNTER — Ambulatory Visit
Admission: RE | Admit: 2021-11-04 | Discharge: 2021-11-04 | Disposition: A | Discharge status: Home / Self Care | Payer: Commercial Managed Care - HMO | Source: Ambulatory Visit | Attending: Nurse Practitioner | Admitting: Nurse Practitioner

## 2021-11-04 ENCOUNTER — Ambulatory Visit: Payer: Self-pay

## 2021-11-04 ENCOUNTER — Encounter: Payer: Self-pay | Admitting: Adult Health

## 2021-11-04 ENCOUNTER — Ambulatory Visit (INDEPENDENT_AMBULATORY_CARE_PROVIDER_SITE_OTHER): Payer: Commercial Managed Care - HMO | Admitting: Adult Health

## 2021-11-04 DIAGNOSIS — R918 Other nonspecific abnormal finding of lung field: Secondary | ICD-10-CM

## 2021-11-04 DIAGNOSIS — Z9989 Dependence on other enabling machines and devices: Secondary | ICD-10-CM | POA: Diagnosis not present

## 2021-11-04 DIAGNOSIS — G4733 Obstructive sleep apnea (adult) (pediatric): Secondary | ICD-10-CM

## 2021-11-04 DIAGNOSIS — Z1231 Encounter for screening mammogram for malignant neoplasm of breast: Secondary | ICD-10-CM

## 2021-11-04 MED ORDER — IOPAMIDOL (ISOVUE-300) INJECTION 61%
75.0000 mL | Freq: Once | INTRAVENOUS | Status: AC | PRN
  Administered 2021-11-04: 75 mL via INTRAVENOUS

## 2021-11-04 NOTE — Addendum Note (Signed)
Addended by: Vanessa Barbara on: 11/04/2021 03:11 PM   Modules accepted: Orders

## 2021-11-04 NOTE — Assessment & Plan Note (Signed)
Moderate obstructive sleep apnea.  Patient has had a significant amount of weight loss with greater than 100 pounds over the last year after gastric bypass surgery.  Sleep apnea symptoms have significantly improved.  We will set patient up for a home sleep study.  Pending those results we will decide if ongoing CPAP usage is indicated.  - discussed how weight can impact sleep and risk for sleep disordered breathing - discussed options to assist with weight loss: combination of diet modification, cardiovascular and strength training exercises   - had an extensive discussion regarding the adverse health consequences related to untreated sleep disordered breathing - specifically discussed the risks for hypertension, coronary artery disease, cardiac dysrhythmias, cerebrovascular disease, and diabetes - lifestyle modification discussed   - discussed how sleep disruption can increase risk of accidents, particularly when driving - safe driving practices were discussed   Plan  Patient Instructions  Set up for home sleep study  Work on healthy weight loss  Do not drive if sleepy  Follow up with 6- 8 weeks to review results and treatment plan.

## 2021-11-04 NOTE — Patient Instructions (Signed)
Set up for home sleep study  Work on healthy weight loss  Do not drive if sleepy  Follow up with 6- 8 weeks to review results and treatment plan.

## 2021-11-04 NOTE — Assessment & Plan Note (Signed)
Patient has significant weight loss since gastric bypass surgery.  Appears to be doing very well.  Patient congratulated on her success.

## 2021-11-04 NOTE — Progress Notes (Signed)
$'@Patient'm$  ID: Melanie Richard, female    DOB: 1960-09-22, 61 y.o.   MRN: [id number]  Chief Complaint  Patient presents with   Follow-up    Referring provider: Nche, Charlene Brooke, NP  HPI: 61 year old female followed for obstructive sleep apnea. Establish for sleep apnea May 2021.  Previously had been followed in [facility] with a sleep specialist.  Was diagnosed with moderate sleep apnea in the past.  TEST/EVENTS :   11/04/2021 Follow up : OSA  Patient presents for follow-up visit.  Patient has underlying sleep apnea.  Has previously been on CPAP.  She was last seen in the office in December 2021.  Patient says she has been diagnosed with sleep apnea greater than 13 years ago.  No copy of previous sleep study . Used CPAP for years. Recently stoppped using on regular basis . Patient says she had gastric bypass surgery.  Weight is down over 100 pounds since last visit.  Patient says that she has been trying to wear her CPAP but the machine is getting old and not working well.  She is curious if she still needs it says she is lost so much weight.  Current weight is at 255 pounds with a BMI at 37.  She is feeling better since losing weight. Denies any significant snoring, daytime sleepiness, restless sleep.      No Known Allergies  Immunization History  Administered Date(s) Administered   PFIZER(Purple Top)SARS-COV-2 Vaccination 06/13/2019, 07/04/2019   PPD Test 10/29/2019    Past Medical History:  Diagnosis Date   Back pain    H/O blood clots    Hypertension    Pacemaker    Sleep apnea    cpap nightly    Tobacco History: Social History   Tobacco Use  Smoking Status Never  Smokeless Tobacco Never   Counseling given: Not Answered   Outpatient Medications Prior to Visit  Medication Sig Dispense Refill   albuterol (PROVENTIL) (2.5 MG/3ML) 0.083% nebulizer solution Take 3 mLs (2.5 mg total) by nebulization every 6 (six) hours as needed for wheezing or  shortness of breath. 75 mL 12   albuterol (VENTOLIN HFA) 108 (90 Base) MCG/ACT inhaler Inhale 1-2 puffs into the lungs every 6 (six) hours as needed for wheezing or shortness of breath. 8 g 0   amLODipine (NORVASC) 5 MG tablet TAKE 1 TABLET BY MOUTH EVERY DAY 90 tablet 3   aspirin 81 MG EC tablet Take 81 mg by mouth daily.      metoprolol tartrate (LOPRESSOR) 50 MG tablet Take 50 mg by mouth daily.     traZODone (DESYREL) 50 MG tablet Take by mouth.     Vitamin D, Ergocalciferol, (DRISDOL) 1.25 MG (50000 UNIT) CAPS capsule Take 1 capsule (50,000 Units total) by mouth every 7 (seven) days. 4 capsule 0   hydrochlorothiazide (HYDRODIURIL) 12.5 MG tablet TAKE 1 TABLET (12.5 MG TOTAL) BY MOUTH DAILY. NO ADDITIONAL REFILLS WITHOUT OFFICE VISIT (Patient not taking: Reported on 10/11/2021) 30 tablet 2   ibuprofen (ADVIL) 600 MG tablet Take 1 tablet (600 mg total) by mouth every 8 (eight) hours as needed (with food). (Patient not taking: Reported on 10/11/2021) 30 tablet 0   metFORMIN (GLUCOPHAGE) 500 MG tablet Take 1 tablet (500 mg total) by mouth 2 (two) times daily with a meal. With meal (Patient not taking: Reported on 10/11/2021) 60 tablet 0   methocarbamol (ROBAXIN) 500 MG tablet Take 1 tablet (500 mg total) by mouth every 8 (eight) hours as needed  for muscle spasms. (Patient not taking: Reported on 11/04/2021) 21 tablet 0   No facility-administered medications prior to visit.     Review of Systems:   Constitutional:   No  weight loss, night sweats,  Fevers, chills, fatigue, or  lassitude.  HEENT:   No headaches,  Difficulty swallowing,  Tooth/dental problems, or  Sore throat,                No sneezing, itching, ear ache, nasal congestion, post nasal drip,   CV:  No chest pain,  Orthopnea, PND, swelling in lower extremities, anasarca, dizziness, palpitations, syncope.   GI  No heartburn, indigestion, abdominal pain, nausea, vomiting, diarrhea, change in bowel habits, loss of appetite, bloody  stools.   Resp: No shortness of breath with exertion or at rest.  No excess mucus, no productive cough,  No non-productive cough,  No coughing up of blood.  No change in color of mucus.  No wheezing.  No chest wall deformity  Skin: no rash or lesions.  GU: no dysuria, change in color of urine, no urgency or frequency.  No flank pain, no hematuria   MS:  No joint pain or swelling.  No decreased range of motion.  No back pain.    Physical Exam  BP 114/80 (BP Location: Left Arm, Patient Position: Sitting, Cuff Size: Large)   Pulse 60   Temp 97.9 F (36.6 C) (Oral)   Ht '5\' 9"'$  (1.753 m)   Wt 255 lb 9.6 oz (115.9 kg)   BMI 37.75 kg/m   GEN: A/Ox3; pleasant , NAD, well nourished    HEENT:  [place]/AT,   NOSE-clear, THROAT-clear, no lesions, no postnasal drip or exudate noted. Class 3 MP airway   NECK:  Supple w/ fair ROM; no JVD; normal carotid impulses w/o bruits; no thyromegaly or nodules palpated; no lymphadenopathy.    RESP  Clear  P & A; w/o, wheezes/ rales/ or rhonchi. no accessory muscle use, no dullness to percussion  CARD:  RRR, no m/r/g, no peripheral edema, pulses intact, no cyanosis or clubbing.  GI:   Soft & nt; nml bowel sounds; no organomegaly or masses detected.   Musco: Warm bil, no deformities or joint swelling noted.   Neuro: alert, no focal deficits noted.    Skin: Warm, no lesions or rashes    Lab Results:  CBC   BNP No results found for: "BNP"  ProBNP No results found for: "PROBNP"  Imaging: No results found.        No data to display          No results found for: "NITRICOXIDE"      Assessment & Plan:   OSA on CPAP Moderate obstructive sleep apnea.  Patient has had a significant amount of weight loss with greater than 100 pounds over the last year after gastric bypass surgery.  Sleep apnea symptoms have significantly improved.  We will set patient up for a home sleep study.  Pending those results we will decide if ongoing CPAP  usage is indicated.  - discussed how weight can impact sleep and risk for sleep disordered breathing - discussed options to assist with weight loss: combination of diet modification, cardiovascular and strength training exercises   - had an extensive discussion regarding the adverse health consequences related to untreated sleep disordered breathing - specifically discussed the risks for hypertension, coronary artery disease, cardiac dysrhythmias, cerebrovascular disease, and diabetes - lifestyle modification discussed   - discussed how sleep disruption can  increase risk of accidents, particularly when driving - safe driving practices were discussed   Plan  Patient Instructions  Set up for home sleep study  Work on healthy weight loss  Do not drive if sleepy  Follow up with 6- 8 weeks to review results and treatment plan.       Morbid obesity ([place]) Patient has significant weight loss since gastric bypass surgery.  Appears to be doing very well.  Patient congratulated on her success.    Rexene Edison, NP 11/04/2021

## 2021-11-24 ENCOUNTER — Encounter (INDEPENDENT_AMBULATORY_CARE_PROVIDER_SITE_OTHER): Payer: Self-pay

## 2022-02-01 ENCOUNTER — Ambulatory Visit: Payer: Commercial Managed Care - HMO

## 2022-02-01 DIAGNOSIS — G4733 Obstructive sleep apnea (adult) (pediatric): Secondary | ICD-10-CM

## 2022-02-15 ENCOUNTER — Telehealth: Payer: Self-pay | Admitting: Pulmonary Disease

## 2022-02-15 DIAGNOSIS — G4733 Obstructive sleep apnea (adult) (pediatric): Secondary | ICD-10-CM | POA: Diagnosis not present

## 2022-02-15 NOTE — Telephone Encounter (Signed)
Call patient  Sleep study result  Date of study: 02/02/2022  Impression: Negative for significant sleep disordered breathing Mild oxygen desaturations  Recommendation: With significant weight loss, current study does not reveal significant sleep apnea  Continuing weight management she will continue to help symptoms  Continue regular exercises and weight loss

## 2022-02-16 NOTE — Telephone Encounter (Signed)
ATC x1.  No answer.  LVM to return call.

## 2022-02-16 NOTE — Telephone Encounter (Signed)
Can set up office visit or virtual visit to go over results if she would like  HST is neg for OSA. No further CPAP is indicated.

## 2022-02-16 NOTE — Telephone Encounter (Signed)
Pt called the office and I let her know the results of the HST and she verbalized understanding. Nothing further needed.

## 2022-02-16 NOTE — Telephone Encounter (Signed)
ATC.  No answer.  LVM to return call.  If she calls back, offer her an in person or virtual visit with TP to review HST if she would like to do so.

## 2022-04-04 ENCOUNTER — Encounter: Payer: Self-pay | Admitting: Nurse Practitioner

## 2022-04-04 ENCOUNTER — Ambulatory Visit (INDEPENDENT_AMBULATORY_CARE_PROVIDER_SITE_OTHER): Payer: Commercial Managed Care - HMO | Admitting: Nurse Practitioner

## 2022-04-04 VITALS — BP 142/70 | HR 60 | Temp 96.8°F | Ht 69.0 in | Wt 248.2 lb

## 2022-04-04 DIAGNOSIS — I10 Essential (primary) hypertension: Secondary | ICD-10-CM | POA: Diagnosis not present

## 2022-04-04 DIAGNOSIS — R7303 Prediabetes: Secondary | ICD-10-CM | POA: Insufficient documentation

## 2022-04-04 DIAGNOSIS — M79621 Pain in right upper arm: Secondary | ICD-10-CM

## 2022-04-04 LAB — POCT GLYCOSYLATED HEMOGLOBIN (HGB A1C): Hemoglobin A1C: 5.5 % (ref 4.0–5.6)

## 2022-04-04 MED ORDER — AMLODIPINE BESYLATE 5 MG PO TABS: 5.0000 mg | ORAL_TABLET | Freq: Every day | ORAL | 3 refills | Status: AC

## 2022-04-04 NOTE — Patient Instructions (Addendum)
hgbA1c at 5.5%: normal Monitor BP at home 2-3x/week. Send BP reading via mychart in 1week. Maintain current med doses.

## 2022-04-04 NOTE — Assessment & Plan Note (Signed)
Repeat hgbA1c at 5.5% Improved with diet and exercise Repeat in 64month

## 2022-04-04 NOTE — Progress Notes (Signed)
Established Patient Visit  Patient: Melanie Richard   DOB: [birth year]   61 y.o. Female  MRN: [id number] Visit Date: 04/04/2022  Subjective:    Chief Complaint  Patient presents with   Office Visit    Prediabetes/ HTN Doesn't check BP  Pt fasting Knot under right underarm x 4 weeks     HPI Melanie Richard reports tenderness in right axilla region x 64month She denies any chest wall injury or fever or cough or SOB. Last mammogram 10/2021: normal.  Essential hypertension No BP check at home, no HA, no dizziness, no LE edema, no CP. Reports she is compliant with med doses (amlodipine '5mg'$  and metoprolol '50mg'$ ) and DASH diet. BP Readings from Last 3 Encounters:  04/04/22 (!) 142/70  11/04/21 114/80  10/11/21 138/88    Advised to Monitor BP at home 2-3x/week. Send BP reading via mychart in 1week. Maintain current med doses.  Pre-diabetes Repeat hgbA1c at 5.5% Improved with diet and exercise Repeat in 632month Reviewed medical, surgical, and social history today  Medications: Outpatient Medications Prior to Visit  Medication Sig   albuterol (PROVENTIL) (2.5 MG/3ML) 0.083% nebulizer solution Take 3 mLs (2.5 mg total) by nebulization every 6 (six) hours as needed for wheezing or shortness of breath.   albuterol (VENTOLIN HFA) 108 (90 Base) MCG/ACT inhaler Inhale 1-2 puffs into the lungs every 6 (six) hours as needed for wheezing or shortness of breath.   aspirin 81 MG EC tablet Take 81 mg by mouth daily.    metoprolol tartrate (LOPRESSOR) 50 MG tablet Take 50 mg by mouth daily.   traZODone (DESYREL) 50 MG tablet Take by mouth.   Vitamin D, Ergocalciferol, (DRISDOL) 1.25 MG (50000 UNIT) CAPS capsule Take 1 capsule (50,000 Units total) by mouth every 7 (seven) days.   [DISCONTINUED] amLODipine (NORVASC) 5 MG tablet TAKE 1 TABLET BY MOUTH EVERY DAY   [DISCONTINUED] metoprolol succinate (TOPROL-XL) 25 MG 24 hr tablet Take 25 mg by mouth daily.   No facility-administered  medications prior to visit.   Reviewed past medical and social history.   ROS per HPI above      Objective:  BP (!) 142/70   Pulse 60   Temp (!) 96.8 F (36 C) (Temporal)   Ht '5\' 9"'$  (1.753 m)   Wt 248 lb 3.2 oz (112.6 kg)   SpO2 90%   BMI 36.65 kg/m      Physical Exam Vitals and nursing note reviewed.  Cardiovascular:     Rate and Rhythm: Normal rate and regular rhythm.     Pulses: Normal pulses.     Heart sounds: Normal heart sounds.  Pulmonary:     Effort: Pulmonary effort is normal.  Chest:  Breasts:    Right: Tenderness present. No bleeding, inverted nipple, mass, nipple discharge or skin change.  Musculoskeletal:     Right lower leg: No edema.     Left lower leg: No edema.  Lymphadenopathy:     Upper Body:     Right upper body: No supraclavicular, axillary or pectoral adenopathy.  Neurological:     Mental Status: She is alert and oriented to person, place, and time.     Results for orders placed or performed in visit on 04/04/22  POCT glycosylated hemoglobin (Hb A1C)  Result Value Ref Range   Hemoglobin A1C 5.5 4.0 - 5.6 %      Assessment & Plan:  Problem List Items Addressed This Visit       Cardiovascular and Mediastinum   Essential hypertension - Primary    No BP check at home, no HA, no dizziness, no LE edema, no CP. Reports she is compliant with med doses (amlodipine '5mg'$  and metoprolol '50mg'$ ) and DASH diet. BP Readings from Last 3 Encounters:  04/04/22 (!) 142/70  11/04/21 114/80  10/11/21 138/88    Advised to Monitor BP at home 2-3x/week. Send BP reading via mychart in 1week. Maintain current med doses.      Relevant Medications   amLODipine (NORVASC) 5 MG tablet     Other   Pre-diabetes    Repeat hgbA1c at 5.5% Improved with diet and exercise Repeat in 41month      Relevant Orders   POCT glycosylated hemoglobin (Hb A1C) (Completed)   Other Visit Diagnoses     Pain in axilla, right       Relevant Orders   [place]     Return in about 6 months (around 10/04/2022) for CPE (fasting, breast and pelvic exam).     CWilfred Lacy NP

## 2022-04-04 NOTE — Assessment & Plan Note (Addendum)
No BP check at home, no HA, no dizziness, no LE edema, no CP. Reports she is compliant with med doses (amlodipine '5mg'$  and metoprolol '50mg'$ ) and DASH diet. BP Readings from Last 3 Encounters:  04/04/22 (!) 142/70  11/04/21 114/80  10/11/21 138/88    Advised to Monitor BP at home 2-3x/week. Send BP reading via mychart in 1week. Maintain current med doses.

## 2022-04-14 ENCOUNTER — Ambulatory Visit: Payer: Self-pay | Admitting: Nurse Practitioner

## 2022-04-21 ENCOUNTER — Other Ambulatory Visit: Payer: Self-pay | Admitting: Nurse Practitioner

## 2022-04-21 DIAGNOSIS — M79621 Pain in right upper arm: Secondary | ICD-10-CM

## 2022-05-09 ENCOUNTER — Ambulatory Visit
Admission: RE | Admit: 2022-05-09 | Discharge: 2022-05-09 | Disposition: A | Discharge status: Home / Self Care | Payer: Commercial Managed Care - HMO | Source: Ambulatory Visit | Attending: Nurse Practitioner | Admitting: Nurse Practitioner

## 2022-05-09 ENCOUNTER — Ambulatory Visit
Admission: RE | Admit: 2022-05-09 | Discharge: 2022-05-09 | Disposition: A | Discharge status: Home / Self Care | Payer: 59 | Source: Ambulatory Visit | Attending: Nurse Practitioner | Admitting: Nurse Practitioner

## 2022-05-09 DIAGNOSIS — M79621 Pain in right upper arm: Secondary | ICD-10-CM

## 2022-10-17 ENCOUNTER — Encounter: Payer: Commercial Managed Care - HMO | Admitting: Nurse Practitioner

## 2023-06-19 ENCOUNTER — Encounter: Payer: Self-pay | Admitting: Adult Health
